# Patient Record
Sex: Male | Born: 1945 | Race: White | Hispanic: No | Marital: Married | Smoking: Former smoker
Health system: Southern US, Community
[De-identification: ages and names within clinical notes are randomized; demographics above are authoritative.]

## PROBLEM LIST (undated history)

## (undated) DIAGNOSIS — I43 Cardiomyopathy in diseases classified elsewhere: Secondary | ICD-10-CM

## (undated) DIAGNOSIS — I10 Essential (primary) hypertension: Secondary | ICD-10-CM

## (undated) DIAGNOSIS — I119 Hypertensive heart disease without heart failure: Secondary | ICD-10-CM

## (undated) DIAGNOSIS — K279 Peptic ulcer, site unspecified, unspecified as acute or chronic, without hemorrhage or perforation: Secondary | ICD-10-CM

## (undated) DIAGNOSIS — K5792 Diverticulitis of intestine, part unspecified, without perforation or abscess without bleeding: Secondary | ICD-10-CM

## (undated) DIAGNOSIS — M549 Dorsalgia, unspecified: Secondary | ICD-10-CM

## (undated) HISTORY — DX: Diverticulitis of intestine, part unspecified, without perforation or abscess without bleeding: K57.92

## (undated) HISTORY — PX: BACK SURGERY: SHX140

## (undated) HISTORY — DX: Cardiomyopathy in diseases classified elsewhere: I43

## (undated) HISTORY — DX: Peptic ulcer, site unspecified, unspecified as acute or chronic, without hemorrhage or perforation: K27.9

## (undated) HISTORY — DX: Essential (primary) hypertension: I10

## (undated) HISTORY — DX: Dorsalgia, unspecified: M54.9

## (undated) HISTORY — DX: Hypertensive heart disease without heart failure: I11.9

---

## 2002-05-18 ENCOUNTER — Encounter: Payer: Self-pay | Admitting: Neurosurgery

## 2002-05-18 ENCOUNTER — Ambulatory Visit (HOSPITAL_COMMUNITY): Admission: RE | Admit: 2002-05-18 | Discharge: 2002-05-19 | Payer: Self-pay | Admitting: Neurosurgery

## 2002-06-11 ENCOUNTER — Encounter: Payer: Self-pay | Admitting: Neurosurgery

## 2002-06-11 ENCOUNTER — Observation Stay (HOSPITAL_COMMUNITY): Admission: RE | Admit: 2002-06-11 | Discharge: 2002-06-12 | Payer: Self-pay | Admitting: Neurosurgery

## 2002-08-18 ENCOUNTER — Ambulatory Visit: Admission: RE | Admit: 2002-08-18 | Discharge: 2002-08-18 | Payer: Self-pay | Admitting: Neurosurgery

## 2002-10-18 ENCOUNTER — Ambulatory Visit (HOSPITAL_COMMUNITY): Admission: RE | Admit: 2002-10-18 | Discharge: 2002-10-19 | Payer: Self-pay | Admitting: Neurosurgery

## 2002-10-18 ENCOUNTER — Encounter: Payer: Self-pay | Admitting: Neurosurgery

## 2003-02-16 ENCOUNTER — Encounter: Payer: Self-pay | Admitting: Neurosurgery

## 2003-02-16 ENCOUNTER — Encounter: Payer: Self-pay | Admitting: Radiology

## 2003-02-16 ENCOUNTER — Encounter: Admission: RE | Admit: 2003-02-16 | Discharge: 2003-02-16 | Payer: Self-pay | Admitting: Neurosurgery

## 2003-03-03 ENCOUNTER — Encounter: Admission: RE | Admit: 2003-03-03 | Discharge: 2003-03-03 | Payer: Self-pay | Admitting: Neurosurgery

## 2003-11-11 ENCOUNTER — Inpatient Hospital Stay (HOSPITAL_COMMUNITY): Admission: RE | Admit: 2003-11-11 | Discharge: 2003-11-16 | Payer: Self-pay | Admitting: Neurosurgery

## 2005-11-08 ENCOUNTER — Encounter: Admission: RE | Admit: 2005-11-08 | Discharge: 2005-11-08 | Payer: Self-pay | Admitting: Anesthesiology

## 2005-12-23 ENCOUNTER — Ambulatory Visit (HOSPITAL_BASED_OUTPATIENT_CLINIC_OR_DEPARTMENT_OTHER): Admission: RE | Admit: 2005-12-23 | Discharge: 2005-12-23 | Payer: Self-pay | Admitting: Urology

## 2006-11-25 ENCOUNTER — Ambulatory Visit (HOSPITAL_COMMUNITY): Admission: RE | Admit: 2006-11-25 | Discharge: 2006-11-25 | Payer: Self-pay | Admitting: Anesthesiology

## 2008-02-29 ENCOUNTER — Ambulatory Visit (HOSPITAL_COMMUNITY): Admission: RE | Admit: 2008-02-29 | Discharge: 2008-02-29 | Payer: Self-pay | Admitting: Anesthesiology

## 2009-12-27 ENCOUNTER — Ambulatory Visit: Payer: Self-pay | Admitting: Cardiovascular Disease

## 2010-07-31 ENCOUNTER — Other Ambulatory Visit: Payer: Self-pay | Admitting: *Deleted

## 2010-07-31 DIAGNOSIS — I10 Essential (primary) hypertension: Secondary | ICD-10-CM

## 2010-07-31 NOTE — Telephone Encounter (Signed)
REFILL PER FAX FROM PHARMACY  

## 2010-08-09 ENCOUNTER — Other Ambulatory Visit: Payer: Self-pay | Admitting: Cardiovascular Disease

## 2010-08-09 DIAGNOSIS — I10 Essential (primary) hypertension: Secondary | ICD-10-CM

## 2010-08-09 MED ORDER — CLONIDINE HCL 0.3 MG PO TABS: 0.3000 mg | ORAL_TABLET | Freq: Two times a day (BID) | ORAL | Status: DC

## 2010-08-09 MED ORDER — RAMIPRIL 10 MG PO CAPS: 10.0000 mg | ORAL_CAPSULE | Freq: Every day | ORAL | Status: DC

## 2010-08-09 NOTE — Telephone Encounter (Signed)
Fax received from pharmacy. Jodette Nayleah Gamel RN  

## 2010-09-05 ENCOUNTER — Other Ambulatory Visit: Payer: Self-pay | Admitting: *Deleted

## 2010-09-05 DIAGNOSIS — I10 Essential (primary) hypertension: Secondary | ICD-10-CM

## 2010-09-05 MED ORDER — METOPROLOL SUCCINATE ER 200 MG PO TB24: 200.0000 mg | ORAL_TABLET | Freq: Every day | ORAL | Status: DC

## 2010-09-05 NOTE — Telephone Encounter (Signed)
Fax received from pharmacy. Refill completed. Jodette Ewa Hipp RN  

## 2010-09-21 NOTE — H&P (Signed)
NAMETREVONNE, NYLAND NO.:  [id number]   MEDICAL RECORD NO.:  [id number]                   PATIENT TYPE:  OIB   LOCATION:  3036                                 FACILITY:  [facility]   PHYSICIAN:  Hilda Lias, M.D.                DATE OF BIRTH:  1945/07/26   DATE OF ADMISSION:  10/18/2002  DATE OF DISCHARGE:                                HISTORY & PHYSICAL   HISTORY OF PRESENT ILLNESS:  Mr. Coutant is a gentleman who in February  2004 underwent L4-5 diskectomy through a extraforaminal port.  At the time  he had some degenerative joint disease, plus a marked osteophyte  compromising the L4 nerve root.  The patient did well for awhile but in two  to three weeks, he started to complain of pain all the way down to his right  leg.  The patient has had every single conservative treatment including  epidural injections and pain medications and he is not any better.  Repeat  MRI showed that although the extraforaminal component was clean, there was  quite a bit of interforminal component such as degenerative disk disease,  hypertrophic facet.  Because of persistent pain, and because he failed  conservative treatment, he wanted to proceed with surgery.   PAST MEDICAL HISTORY:  1. Right L4-5 extraforaminal diskectomy.  2. He has history of hypertension.  3. He has history of diabetes and high blood pressure.   SOCIAL HISTORY:  He drinks socially.  He does not smoke.   FAMILY HISTORY:  History of diabetes and high blood pressure.   PHYSICAL EXAMINATION:  The patient came to my office with his wife on  several occasions.  He is complaining of back pain that radiates down the  right leg.  He is limping from the right leg and he had difficulty standing.   PHYSICAL EXAMINATION:  HEENT:  Normal.  NECK:  Normal.  LUNGS:  Clear.  HEART:  Heart sounds normal.  ABDOMEN:  Normal.  EXTREMITIES:  Normal pulses.  NEUROLOGIC:  Mental status normal.  Cranial nerves  normal.  Strength 5/5  except for weakness of dorsiflexors of the right foot.  He also has mild  weakening in the right quadriceps.  Reflexes are symmetrical.  On sensory,  he complained of numbness which involved mostly the L4 nerve root.   LABORATORY DATA:  The MRI results shows he has quite a bit of degenerative  disk disease with a hypertrophied facet at L4-5 bilaterally, right worse  than the left.  The extraforaminal components previously seen on the MRI are  gone.   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    RECOMMENDATIONS:  The patient will be admitted and we are going to proceed  with a right L4-5 foraminotomy and decision of diskectomy will be made  during surgery.  The patient and his wife know all the risks such as  infection, CSF leak, no improvement whatsoever, need of further surgery  which might include fusion.                                               Hilda Lias, M.D.    EB/MEDQ  D:  10/18/2002  T:  10/18/2002  Job:  [id number]

## 2010-09-21 NOTE — Op Note (Signed)
NAME:  Christian [name], ROUT NO.:  [id number]   MEDICAL RECORD NO.:  [id number]                   PATIENT TYPE:  INP   LOCATION:  2899                                 FACILITY:  [facility]   PHYSICIAN:  Hilda Lias, M.D.                DATE OF BIRTH:  09-07-1945   DATE OF PROCEDURE:  11/11/2003  DATE OF DISCHARGE:                                 OPERATIVE REPORT   PREOPERATIVE DIAGNOSES:  L4-5, L5-S1 herniated disk, degenerative disk  disease, chronic back pain, __________-sided radiculopathy.   POSTOPERATIVE DIAGNOSES:  L4-5, L5-S1 herniated disk, degenerative disk  disease, chronic back pain, __________-sided radiculopathy.   PROCEDURE:  L4-5, L5-S1 diskectomy, decompression of the L4, L5, S1 nerve  root, interbody fusion with allograft, pedicle screws L4 to S1 bilaterally  with a Cross Link posterior arthrodesis without allograft.  C-arm  ___________   SURGEON:  Hilda Lias, MD.   ASSISTANT:  Coletta Memos, M.D.   CLINICAL HISTORY:  The patient was admitted because of back pain with  radiation down to both legs.  The patient previously underwent an L4-5  diskectomy through an extraforaminal approach.  X-rays show severe  degenerative disk disease at the L4-5, 5-1.  After the patient had failed a  course of treatment, he wanted to proceed with surgery.  The risks were  explained in the history and physical.   PROCEDURE:  The patient was taken to the OR and placed on the appropriate  monitoring.  The back was prepped with Betadine.  A midline incision  following the previous one was made from L3-4 down to L5-S1.  The muscles  were retracted laterally.  With the Leksell, we removed the spinous process  of 4-5.  We went laterally and did a facetectomy.  On the right side, we  found quite a bit scar tissue mostly at the level of 4-5.  Using the drill,  we did a bilateral laminectomy.  We decompressed the L4-L5-S1 nerve root.  The worse was the level at  the right side where there were quite a bit of  adhesions mostly affecting the L5 and L4 nerve root.  Having done this, we  entered the disk space and bilateral diskectomy of 4-5 was done.  The same  procedure was done at the level of 5-1.  Using the curette, we removed the  end-plate posterolateral.  This was followed by an hourglass of 10 x 24 at 4-  5 and 8 x 24 at the level of 5-1.  Then using Vitoss plus the patient's own  bone, we filled up the rest of the space.  Then with the help of the C-arm,  we localized the pedicle of 4-5 and S1.  A pedicle probe was inserted  followed by a screw, which was 6.5 x 45.  AP and lateral showed good  position of the pedicle screws.  From then on, a rod was inserted  on top of  the pedicle and screws followed by a CAPP.  It was secured in placed.  Then  a Cross Link from right to left was used.  We went laterally, we drilled the  transverse process of 4-5 and the ala of the sacrum.  A mix of Vitoss as  well as the autograft was used to fill up the space.  From then on, the area  was irrigated.  __________ was left in the epidural space, and the wound  closed with Vicryl and a Steri-Strip.                                               Hilda Lias, M.D.    EB/MEDQ  D:  11/11/2003  T:  11/12/2003  Job:  [id number]

## 2010-09-21 NOTE — Op Note (Signed)
Christian [name], Christian [name] NO.:  [id number]   MEDICAL RECORD NO.:  [id number]                   PATIENT TYPE:  OIB   LOCATION:  2888                                 FACILITY:  [facility]   PHYSICIAN:  Hilda Lias, M.D.                DATE OF BIRTH:  [birth year]   DATE OF PROCEDURE:  10/18/2002  DATE OF DISCHARGE:                                 OPERATIVE REPORT   PREOPERATIVE DIAGNOSIS:  Right L4-5 herniated disk with stenosis, incidental  left L5-S1 herniated disk.   POSTOPERATIVE DIAGNOSIS:  Right L4-5 herniated disk with stenosis,  incidental left L5-S1 herniated disk.   PROCEDURE:  Right L5-S1 diskectomy, foraminotomy, decompression of the L4  and L5 nerve root.  Microscope.   SURGEON:  Hilda Lias, M.D.   ASSISTANT:  Cristi Loron, M.D.   INDICATIONS FOR PROCEDURE:  The patient is a 65 year old gentleman with  diabetes who back in February underwent right L4-5 extraforaminal diskectomy  because of L4 radiculopathy.  The patient did well for a while, but now is  complaining of worsening of the pain down to the right foot.  He has failed  with more conservative treatment.  X-ray showed a stenosis with degenerative  disk disease at the level of 4-5. He has incidental 5-1 on the left.  Surgery was advised and the risks were explained in the history and  physical.   DESCRIPTION OF PROCEDURE:  The patient was taken to the OR where he was  positioned in a prone manner.  The back was prepped with Betadine.  The  drapes were applied.  We excised the previous scar.  His skin is a little  bit too loose and the scar in the midline a little bit to the left.  Nevertheless, after dissection we did our muscle retraction on the right  side.  We went straight down to the L4-5 space.  X-ray indeed showed that we  were in that area.  With the drill, we removed part of the lamina of L5 and  L4 and we removed the yellow ligament.  We brought the microscope  into the  area.  Indeed there was herniated disk with some extension down into the  level of 4-5. Incision was made and a large amount of degenerative disk  disease not only medially, but also laterally was removed.  We did a total  gross diskectomy using the microcurettes.  At the end, we had good  decompression for the foramen of L4 and L5.  X-ray was taken which showed  that indeed that was the right area. There was some scar tissue where we  previously had the extraforaminal diskectomy and lysis was accomplished from  the intraforaminal approach.  Having good decompression, the area was  irrigated and Valsalva maneuver was negative and the wound was closed with  Vicryl and Steri-Strips.  Hilda Lias, M.D.    EB/MEDQ  D:  10/18/2002  T:  10/18/2002  Job:  [id number]

## 2010-09-21 NOTE — Op Note (Signed)
Christian [name], Christian [name] NO.:  [id number]   MEDICAL RECORD NO.:  [id number]          PATIENT TYPE:  AMB   LOCATION:  [facility]                         FACILITY:  [facility]   PHYSICIAN:  Valetta Fuller, M.D.  DATE OF BIRTH:  [birth year]   DATE OF PROCEDURE:  12/23/2005  DATE OF DISCHARGE:                                 OPERATIVE REPORT   PREOPERATIVE DIAGNOSIS:  Right hydronephrosis.   POSTOPERATIVE DIAGNOSES:  1. Right hydronephrosis.  2. Partial ureteropelvic junction obstruction.   PROCEDURE PERFORMED:  Cystoscopy, right retrograde pyelography, right  ureteroscopy.   SURGEON:  Valetta Fuller, M.D.   ANESTHESIA:  General.   INDICATIONS:  [name] is a 65 year old male who was sent to see me  because of some right hydronephrosis and questionable right proximal  ureteral dilation on an MRI done to assess some back issues.  The patient  was asymptomatic.  On MRI, there was evidence of right hydronephrosis and a  question of a dilated ureter.  The patient subsequently had an ultrasound  which did show mild right hydronephrosis and the right ureter was not  identified.  In our office, we did a CT scan with contrast.  This did show  what appeared to be a dilation of the right renal pelvis and collecting  system without dilation of the ureter, and the CT findings were consistent  potentially with ureteropelvic junction obstruction, probably mild.  I was  concerned, however, that initial imaging studies had suggested the  possibility of a dilated ureter, and therefore, we did feel that additional  assessment was required to make sure that he did not have any ureteral  pathology that potentially would be of more concern.  The patient presents  now for that.   TECHNIQUE AND FINDINGS:  The patient underwent successful induction of  general anesthesia.  He was then placed in lithotomy position and prepped  and draped in the normal manner.  The patient did require some  meatal  dilation to allow for placement of the cystoscope.  The anterior urethra was  unremarkable.  He had minimal trilobar hyperplasia without significant  visual obstruction.  The bladder was otherwise endoscopically unremarkable.   Attention was then turned towards retrograde pyelography.  This was done  with fluoroscopic guidance and representative images were saved.  I  interpreted the retrograde.  An 8-French cone-tip catheter was utilized.  The entire ureter was completely normal.  There was a jet affective contrast  at the ureteropelvic junction with a moderately-dilated renal pelvis and  caliceal system.  The fluoroscopic films were consistent with an underlying  partial UPJ obstruction.   Attention was then turned towards direct vision ureteroscopy.  A guidewire  was placed in the renal pelvis without difficulty.  A long 6.5-French  ureteroscope was then easily engaged in the distal ureter and brought up to  the ureteropelvic junction region.  There, we could easily visualize this  area and there was no other pathology other than  narrowing with some little bit of tortuosity.  Endoscopic findings were  again consistent with an underlying partial UPJ obstruction.  We did not  feel that stent placement was required.  The patient appeared to tolerate  the procedure well, and he was brought to the recovery room in stable  condition.           ______________________________  Valetta Fuller, M.D.  Electronically Signed     DSG/MEDQ  D:  12/23/2005  T:  12/23/2005  Job:  [id number]

## 2010-09-21 NOTE — Discharge Summary (Signed)
NAME:  Christian [name], Christian [name] NO.:  [id number]   MEDICAL RECORD NO.:  [id number]                   PATIENT TYPE:  INP   LOCATION:  3004                                 FACILITY:  [facility]   PHYSICIAN:  Hilda Lias, M.D.                DATE OF BIRTH:  21-Jul-1945   DATE OF ADMISSION:  11/11/2003  DATE OF DISCHARGE:  11/16/2003                                 DISCHARGE SUMMARY   ADMISSION DIAGNOSIS:  Degenerative disk disease, 4-5 and 5-1.   FINAL DIAGNOSIS:  Degenerative joint disease, 4-5 and 5-1.   HISTORY:  The patient was admitted because of back pain with radiation down  to both legs.  X-rays showed severe degenerative disk disease at the level  of 4-5 and 5-1.  Previously, this gentleman had an extraforaminal diskectomy  at the level of 4-5.  Surgery was advised.   LABORATORY DATA:  Normal.   HOSPITAL COURSE:  The patient was taken to surgery and underwent a 4-5  diskectomy followed by fusion with pedicle screw was done.  Today, the  patient is doing much better.  He is ambulating.  He has minimal discomfort.  He is ready to go home.   CONDITION ON DISCHARGE:  Improved.   DISCHARGE MEDICATIONS:  1. Percocet.  2. Baclofen.   DIET:  Regular.   ACTIVITY:  Not to drive, not to do any lifting.   FOLLOWUP:  He will be seen by me in my office in four weeks.                                                Hilda Lias, M.D.    EB/MEDQ  D:  11/16/2003  T:  11/16/2003  Job:  [id number]

## 2010-09-21 NOTE — Op Note (Signed)
NAMECONWAY, FEDORA NO.:  [id number]   MEDICAL RECORD NO.:  [id number]                   PATIENT TYPE:  INP   LOCATION:  3008                                 FACILITY:  [facility]   PHYSICIAN:  Hilda Lias, M.D.                DATE OF BIRTH:  06/13/1945   DATE OF PROCEDURE:  06/11/2002  DATE OF DISCHARGE:                                 OPERATIVE REPORT   PREOPERATIVE DIAGNOSIS:  Right L4-5 extraforaminal herniated disk with a  large osteophyte compromising the L4 nerve root.   POSTOPERATIVE DIAGNOSIS:  Right L4-5 extraforaminal herniated disk with a  large osteophyte compromising the L4 nerve root.   PROCEDURE:  Right L4-5 diskectomy through extrapyramidal approach with  removal of large osteophyte using the left contralateral incision, video  microscope C-arm Metrix.   SURGEON:  Hilda Lias, M.D.   ASSISTANT:  Coletta Memos, M.D.   HISTORY:  Mr. Duckett is a 65 year old gentleman complaining of back and  right leg pain for many years.  MRI was essentially negative.  He has an  incidental disk at L5-S1.  We did a myelogram which showed that indeed he  has a herniated disk, extraforaminal, at L4-5 with a large osteophyte  compromising the L4 nerve root.  The patient wanted to go ahead with surgery  because of the continuation of the pain.   The patient was taken to the OR, and __________ .  Because of the location  of the osteophytes, instead of doing a right incision, we did an incision on  the left side, and with the Metrix using several dilators, we were able to  go straight to the area of 4-5 extraforaminal.  With L2 visualized,  __________ of L4-5.  This was done with the fluoroscopy C-arm.  Having done  this, we drilled the lower facet of L4 and the upper part of L5.  We were  able to identify the L4 nerve root, which was displaced laterally and pushed  backwards.  We tried to introduce a probe, and it was quite narrow  bilaterally.   Then with the C-arm, we were able to localize the disk.  A  diskectomy was accomplished extraforaminal.  Then we followed the nerve  root, and indeed, there was like a groove and would justify compromising the  L4.  Retraction of the nerve root was accomplished with the drill, as well  as the 1 and 2 mm Kerrison punch.  We were able to remove the osteophyte  with plenty of room for the L4 nerve root.  Having done this, the area was  irrigated.  Fentanyl and Depo-Medrol were left in the dural space, and the  wound was closed with Vicryl and Steri-Strips.  Hilda Lias, M.D.    EB/MEDQ  D:  06/11/2002  T:  06/12/2002  Job:  [id number]

## 2010-09-21 NOTE — H&P (Signed)
Christian [name], HINEMAN NO.:  [id number]   MEDICAL RECORD NO.:  [id number]                   PATIENT TYPE:  INP   LOCATION:  2899                                 FACILITY:  [facility]   PHYSICIAN:  Hilda Lias, M.D.                DATE OF BIRTH:  [birth year]   DATE OF ADMISSION:  06/11/2002  DATE OF DISCHARGE:                                HISTORY & PHYSICAL   HISTORY OF PRESENT ILLNESS:  The patient is a gentleman who was seen by me  at the end of last year because of back pain with radiation down to the  right leg which is getting worse.  He has __________.  He had an MRI which  really was not helpful.  The patient has had conservative treatment and he  is not any better.  Because of that, we proceeded with a lumbar myelogram,  and because of the findings he wanted to undergo surgery.   PAST MEDICAL HISTORY:  Negative.   ALLERGIES:  PENICILLIN.   REVIEW OF SYMPTOMS:  Positive for high blood pressure, diabetes, back pain.   SOCIAL HISTORY:  The patient does not smoke, he drinks socially.   FAMILY HISTORY:  History of diabetes and high blood pressure in his family.   PHYSICAL EXAMINATION:  GENERAL:  The patient came to my office with his  wife.  He was limping from the right leg.  NECK:  Normal.  HEART:  Heart sounds normal.  ABDOMEN:  Normal.  EXTREMITIES:  Normal pulses.  NEUROLOGIC:  Mental status normal.  Cranial nerves normal.  Strength was  5/5, except he has some weakness of the quadriceps and iliopsoas.  There is  no ___________.  Reflexes are symmetrical.  No Babinski.  Straight leg  raising is negative at 90 degrees.  __________ stretch maneuver is positive  bilaterally with the right one worse then the left one.   LABORATORY DATA:  The myelogram showed that he has an incidental disk at the  level of 5-1 on the left side, has no problems whatsoever with him.  At the  level of 4-5, he has a herniated disk with two component, a large  osteophyte, and also a soft herniated disk compromising the L4 nerve root.   IMPRESSION:  Right L4-5 extraforaminal herniated disk with a high soft  component compromising the L4 nerve root.   RECOMMENDATIONS:  The patient is being admitted for surgery.  The procedure  will be a right L4-5 extraforaminal diskectomy using the microscope and the  Medrix system.  The patient knows all the risks such as infection, CSF leak,  worsening of the pain, paralysis, no improvement whatsoever, and need for  further surgery.  Hilda Lias, M.D.   EB/MEDQ  D:  06/11/2002  T:  06/11/2002  Job:  [id number]

## 2010-09-21 NOTE — H&P (Signed)
NAME:  Christian [name], Christian [name] NO.:  [id number]   MEDICAL RECORD NO.:  [id number]                   PATIENT TYPE:  INP   LOCATION:  3004                                 FACILITY:  [facility]   PHYSICIAN:  Hilda Lias, M.D.                DATE OF BIRTH:  22-May-1945   DATE OF ADMISSION:  11/11/2003  DATE OF DISCHARGE:                                HISTORY & PHYSICAL   HISTORY OF PRESENT ILLNESS:  Christian [name] was a gentleman who underwent  right L4-5 anterior lumbar diskectomy in June 2004.  Nevertheless, the  patient has been complaining of back pain, radiation down both legs which  has been getting worse lately.  The patient has had conservative treatment  including ___________Percocet 10 mg one or two every three hours without any  improvement.  The patient for a while says that he was doing better, but  nevertheless he continued to get worse.  He had a repeat MRI, and because of  the findings he wants to proceed with surgery.   PAST MEDICAL HISTORY:  1. Lumbar diskectomy, L4-5.  2. History of high blood pressure.  3. Diabetes.   SOCIAL HISTORY:  Does not smoke, drinks socially.   FAMILY HISTORY:  Family history of diabetes, high blood pressure.   REVIEW OF SYSTEMS:  Positive for diabetes and high blood pressure.   PHYSICAL EXAMINATION:  GENERAL:  The patient came to my office with his  wife.  I have been following him for several months.  He denies difficulty  sitting and standing.  HEENT:  Normal.  NECK:  Normal.  HEART:  Normal.  ABDOMEN:  Normal.  EXTREMITIES:  Normal pulses.  NEUROLOGIC:  Mental status normal.  Cranial nerves normal.  Strength:  He  has _________dorsiflexion both feet.  Straight leg raising is positive at 45  degrees bilaterally.  Sensation:  He has decrease of pinprick at the L4 and  L5 nerve root.   LABORATORY DATA:  X-ray as well as MRI shows a severe case of degenerative  disk disease at the level of L4-5.   IMPRESSION:   Degenerative disk disease, L4-5 and L5-S1 with a chronic  radiculopathy.   RECOMMENDATIONS:  The patient is being admitted for L4-5 and L5-S1  diskectomy, interbody fusion, pedicle screws.  He and his wife know the  risks such as infection, CSF leak, worsening of the pain, paralysis, no  improvement whatsoever, damage to the vessels of the abdomen, damage to the  nerves, and need for further surgery.                                                Hilda Lias, M.D.    EB/MEDQ  D:  11/11/2003  T:  11/11/2003  Job:  [id number]

## 2010-12-24 ENCOUNTER — Encounter: Payer: Self-pay | Admitting: Cardiovascular Disease

## 2010-12-25 ENCOUNTER — Other Ambulatory Visit: Payer: Self-pay | Admitting: *Deleted

## 2010-12-25 MED ORDER — AMLODIPINE BESYLATE 10 MG PO TABS: 10.0000 mg | ORAL_TABLET | Freq: Every day | ORAL | Status: DC

## 2010-12-25 NOTE — Telephone Encounter (Signed)
Fax received from pharmacy. Refill completed. Jodette Norville Dani RN  

## 2011-01-09 ENCOUNTER — Encounter: Payer: Self-pay | Admitting: Cardiovascular Disease

## 2011-01-09 ENCOUNTER — Ambulatory Visit (INDEPENDENT_AMBULATORY_CARE_PROVIDER_SITE_OTHER): Payer: Medicare Other | Admitting: Cardiovascular Disease

## 2011-01-09 DIAGNOSIS — E785 Hyperlipidemia, unspecified: Secondary | ICD-10-CM

## 2011-01-09 DIAGNOSIS — I10 Essential (primary) hypertension: Secondary | ICD-10-CM

## 2011-01-09 LAB — BASIC METABOLIC PANEL
BUN: 19 mg/dL (ref 6–23)
CO2: 30 mEq/L (ref 19–32)
Calcium: 9.8 mg/dL (ref 8.4–10.5)
Chloride: 99 mEq/L (ref 96–112)
Creatinine, Ser: 1.2 mg/dL (ref 0.4–1.5)
GFR: 65.39 mL/min (ref >60.00)
Glucose, Bld: 189 mg/dL — ABNORMAL HIGH (ref 70–99)
Potassium: 5.4 mEq/L — ABNORMAL HIGH (ref 3.5–5.1)
Sodium: 138 mEq/L (ref 135–145)

## 2011-01-09 LAB — LIPID PANEL
Cholesterol: 115 mg/dL (ref 0–200)
HDL: 26.4 mg/dL — ABNORMAL LOW (ref >39.00)
Total CHOL/HDL Ratio: 4
Triglycerides: 279 mg/dL — ABNORMAL HIGH (ref 0.0–149.0)
VLDL: 55.8 mg/dL — ABNORMAL HIGH (ref 0.0–40.0)

## 2011-01-09 LAB — HEPATIC FUNCTION PANEL
ALT: 37 U/L (ref 0–53)
AST: 28 U/L (ref 0–37)
Albumin: 4.6 g/dL (ref 3.5–5.2)
Alkaline Phosphatase: 40 U/L (ref 39–117)
Bilirubin, Direct: 0.1 mg/dL (ref 0.0–0.3)
Total Bilirubin: 0.3 mg/dL (ref 0.3–1.2)
Total Protein: 7.4 g/dL (ref 6.0–8.3)

## 2011-01-09 LAB — LDL CHOLESTEROL, DIRECT: Direct LDL: 64.4 mg/dL

## 2011-01-09 NOTE — Progress Notes (Signed)
Christian [name] Date of Birth  03/25/1946 [facility] / [facility] [address]     [address] [place], [place]  [address] [phone]  Fax  [phone]  History of Present Illness:  65 year old gentleman with a history of hypertension and hypertensive cardiopathy.  He's having lots of problems with back pain and sees Dr. Vear Clock. He's able to walk fairly short distances because of his back pain. He denies any cardiac problems with his exercise   Current Outpatient Prescriptions on File Prior to Visit  Medication Sig Dispense Refill  . ALPRAZolam (XANAX) 1 MG tablet Take 1 mg by mouth as needed.        Marland Kitchen amLODipine (NORVASC) 10 MG tablet Take 1 tablet (10 mg total) by mouth daily.  90 tablet  3  . aspirin 81 MG tablet Take 81 mg by mouth daily.        . cloNIDine (CATAPRES) 0.3 MG tablet Take 1 tablet (0.3 mg total) by mouth 2 (two) times daily.  180 tablet  3  . ezetimibe-simvastatin (VYTORIN) 10-10 MG per tablet Take 1 tablet by mouth daily.        . fenofibrate (TRICOR) 145 MG tablet Take 145 mg by mouth daily.        Marland Kitchen levETIRAcetam (KEPPRA) 250 MG tablet Take 1,000 mg by mouth daily.       . metoprolol (TOPROL XL) 200 MG 24 hr tablet Take 1 tablet (200 mg total) by mouth daily.  30 tablet  11  . oxyCODONE (OXYCONTIN) 15 MG TB12 Take 15 mg by mouth as needed.       . potassium chloride (KLOR-CON) 20 MEQ packet Take 20 mEq by mouth daily.        . ramipril (ALTACE) 10 MG capsule Take 1 capsule (10 mg total) by mouth daily.  90 capsule  3  . sitaGLIPtan-metformin (JANUMET) 50-1000 MG per tablet Take 1 tablet by mouth 2 (two) times daily.        Marland Kitchen glipiZIDE (GLUCOTROL) 10 MG tablet Take 10 mg by mouth 2 (two) times daily.          Allergies  Allergen Reactions  . Penicillins     Past Medical History  Diagnosis Date  . Diabetes mellitus   . Hypertension   . Hypertensive cardiomyopathy   . Peptic ulcer disease   . Back pain   . Diverticulitis      Past Surgical History  Procedure Date  . Back surgery     x3    History  Smoking status  . Former Smoker  . Quit date: 12/23/1968  Smokeless tobacco  . Not on file    History  Alcohol Use No    Family History  Problem Relation Age of Onset  . Heart failure Mother   . Cancer Father   . Diabetes Brother     Reviw of Systems:  Reviewed in the HPI.  All other systems are negative.  Physical Exam: BP 114/80  Pulse 91  Ht 5' 7.5" (1.715 m)  Wt 173 lb 3.2 oz (78.563 kg)  BMI 26.73 kg/m2 The patient is alert and oriented x 3.  The mood and affect are normal.   Skin: warm and dry.  Color is normal.    HEENT:   the sclera are nonicteric.  The mucous membranes are moist.  The carotids are 2+ without bruits.  There is no thyromegaly.  There is no JVD.    Lungs: clear.  The chest wall is non tender.    Heart: regular rate with a normal S1 and S2.  There are no murmurs, gallops, or rubs. The PMI is not displaced.     Abdomen: good bowel sounds.  There is no guarding or rebound.  There is no hepatosplenomegaly or tenderness.  There are no masses.   Extremities:  no clubbing, cyanosis, or edema.  The legs are without rashes.  The distal pulses are intact.   Neuro:  Cranial nerves II - XII are intact.  Motor and sensory functions are intact.    The gait is normal.  ECG:  Assessment / Plan:

## 2011-01-09 NOTE — Assessment & Plan Note (Signed)
His blood pressure is well controlled. We'll continue the same medications.

## 2011-01-10 ENCOUNTER — Telehealth: Payer: Self-pay | Admitting: Cardiovascular Disease

## 2011-01-10 ENCOUNTER — Encounter: Payer: Self-pay | Admitting: Cardiovascular Disease

## 2011-01-10 NOTE — Telephone Encounter (Signed)
Called wife back, she stated he has been eating more carbs due to daily nausea/emesis carbs stay down better. She feels its due to elevated pain from back. Pt to go to pcp to evaluate. Wife agreed and labs were forwarded to pcp.

## 2011-01-10 NOTE — Telephone Encounter (Signed)
Patient called with lab results. Pt verbalized understanding. Jodette Fatima Fedie RN  

## 2011-01-10 NOTE — Telephone Encounter (Signed)
Called returning your phone call regarding her husbands lab work. Please call back.

## 2011-01-10 NOTE — Telephone Encounter (Signed)
Calling back because her husband was physically ill and did not remember the instructions that you gave him. She would really appreciate it if you could give her a call back and repeat those instructions. Also would like to know about his potassium levels that he was tested for. Please call back.

## 2011-02-01 ENCOUNTER — Other Ambulatory Visit: Payer: Self-pay | Admitting: Neurosurgery

## 2011-02-01 DIAGNOSIS — M549 Dorsalgia, unspecified: Secondary | ICD-10-CM

## 2011-02-01 DIAGNOSIS — M5410 Radiculopathy, site unspecified: Secondary | ICD-10-CM

## 2011-02-05 ENCOUNTER — Other Ambulatory Visit: Payer: Medicare Other

## 2011-02-05 ENCOUNTER — Ambulatory Visit
Admission: RE | Admit: 2011-02-05 | Discharge: 2011-02-05 | Disposition: A | Discharge status: Home / Self Care | Payer: Medicare Other | Source: Ambulatory Visit | Attending: Neurosurgery | Admitting: Neurosurgery

## 2011-02-05 DIAGNOSIS — M5410 Radiculopathy, site unspecified: Secondary | ICD-10-CM

## 2011-02-05 DIAGNOSIS — M549 Dorsalgia, unspecified: Secondary | ICD-10-CM

## 2011-02-05 MED ORDER — OXYCODONE-ACETAMINOPHEN 5-325 MG PO TABS
3.0000 | ORAL_TABLET | Freq: Once | ORAL | Status: AC
  Administered 2011-02-05: 3 via ORAL

## 2011-02-05 MED ORDER — DIAZEPAM 2 MG PO TABS
5.0000 mg | ORAL_TABLET | Freq: Once | ORAL | Status: AC
  Administered 2011-02-05: 5 mg via ORAL

## 2011-02-05 MED ORDER — IOHEXOL 180 MG/ML  SOLN
15.0000 mL | Freq: Once | INTRAMUSCULAR | Status: AC | PRN
  Administered 2011-02-05: 15 mL via INTRATHECAL

## 2011-02-05 NOTE — Progress Notes (Signed)
Dr. Carlota Raspberry in to visit and discuss pain options with pt post procedure. It was decided valium would be best at this time.

## 2011-02-15 ENCOUNTER — Ambulatory Visit (HOSPITAL_COMMUNITY)
Admission: RE | Admit: 2011-02-15 | Discharge: 2011-02-15 | Disposition: A | Discharge status: Home / Self Care | Payer: Medicare Other | Source: Ambulatory Visit | Attending: Neurosurgery | Admitting: Neurosurgery

## 2011-02-15 ENCOUNTER — Other Ambulatory Visit (HOSPITAL_COMMUNITY): Payer: Self-pay | Admitting: Neurosurgery

## 2011-02-15 ENCOUNTER — Encounter (HOSPITAL_COMMUNITY)
Admission: RE | Admit: 2011-02-15 | Discharge: 2011-02-15 | Disposition: A | Discharge status: Home / Self Care | Payer: Medicare Other | Source: Ambulatory Visit | Attending: Neurosurgery | Admitting: Neurosurgery

## 2011-02-15 DIAGNOSIS — Z01811 Encounter for preprocedural respiratory examination: Secondary | ICD-10-CM

## 2011-02-15 DIAGNOSIS — J438 Other emphysema: Secondary | ICD-10-CM | POA: Insufficient documentation

## 2011-02-15 DIAGNOSIS — Z01812 Encounter for preprocedural laboratory examination: Secondary | ICD-10-CM | POA: Insufficient documentation

## 2011-02-15 DIAGNOSIS — Z01818 Encounter for other preprocedural examination: Secondary | ICD-10-CM | POA: Insufficient documentation

## 2011-02-15 LAB — CBC
HCT: 39 % (ref 39.0–52.0)
Hemoglobin: 13.2 g/dL (ref 13.0–17.0)
MCHC: 33.8 g/dL (ref 30.0–36.0)
RBC: 4.46 MIL/uL (ref 4.22–5.81)

## 2011-02-15 LAB — ABO/RH: ABO/RH(D): A POS

## 2011-02-15 LAB — DIFFERENTIAL
Basophils Absolute: 0.1 10*3/uL (ref 0.0–0.1)
Basophils Relative: 1 % (ref 0–1)
Lymphocytes Relative: 22 % (ref 12–46)
Monocytes Absolute: 0.6 10*3/uL (ref 0.1–1.0)
Neutro Abs: 5.2 10*3/uL (ref 1.7–7.7)
Neutrophils Relative %: 64 % (ref 43–77)

## 2011-02-15 LAB — SURGICAL PCR SCREEN
MRSA, PCR: NEGATIVE
Staphylococcus aureus: NEGATIVE

## 2011-02-15 LAB — TYPE AND SCREEN: Antibody Screen: NEGATIVE

## 2011-02-15 LAB — BASIC METABOLIC PANEL
BUN: 17 mg/dL (ref 6–23)
CO2: 28 mEq/L (ref 19–32)
Chloride: 101 mEq/L (ref 96–112)
GFR calc non Af Amer: 72 mL/min — ABNORMAL LOW (ref >90)
Glucose, Bld: 137 mg/dL — ABNORMAL HIGH (ref 70–99)
Potassium: 4.9 mEq/L (ref 3.5–5.1)
Sodium: 139 mEq/L (ref 135–145)

## 2011-02-20 ENCOUNTER — Inpatient Hospital Stay (HOSPITAL_COMMUNITY)
Admission: RE | Admit: 2011-02-20 | Discharge: 2011-02-25 | DRG: 458 | Disposition: A | Discharge status: Rehabilitation Facility | Payer: Medicare Other | Source: Ambulatory Visit | Attending: Neurosurgery | Admitting: Neurosurgery

## 2011-02-20 ENCOUNTER — Inpatient Hospital Stay (HOSPITAL_COMMUNITY): Payer: Medicare Other

## 2011-02-20 DIAGNOSIS — E7800 Pure hypercholesterolemia, unspecified: Secondary | ICD-10-CM | POA: Diagnosis present

## 2011-02-20 DIAGNOSIS — Z981 Arthrodesis status: Secondary | ICD-10-CM

## 2011-02-20 DIAGNOSIS — M5137 Other intervertebral disc degeneration, lumbosacral region: Secondary | ICD-10-CM | POA: Diagnosis present

## 2011-02-20 DIAGNOSIS — Z794 Long term (current) use of insulin: Secondary | ICD-10-CM

## 2011-02-20 DIAGNOSIS — M51379 Other intervertebral disc degeneration, lumbosacral region without mention of lumbar back pain or lower extremity pain: Secondary | ICD-10-CM | POA: Diagnosis present

## 2011-02-20 DIAGNOSIS — E119 Type 2 diabetes mellitus without complications: Secondary | ICD-10-CM | POA: Diagnosis present

## 2011-02-20 DIAGNOSIS — Z79899 Other long term (current) drug therapy: Secondary | ICD-10-CM

## 2011-02-20 DIAGNOSIS — I10 Essential (primary) hypertension: Secondary | ICD-10-CM | POA: Diagnosis present

## 2011-02-20 DIAGNOSIS — M4000 Postural kyphosis, site unspecified: Principal | ICD-10-CM | POA: Diagnosis present

## 2011-02-20 DIAGNOSIS — M48062 Spinal stenosis, lumbar region with neurogenic claudication: Secondary | ICD-10-CM | POA: Diagnosis present

## 2011-02-20 LAB — GLUCOSE, CAPILLARY
Glucose-Capillary: 162 mg/dL — ABNORMAL HIGH (ref 70–99)
Glucose-Capillary: 168 mg/dL — ABNORMAL HIGH (ref 70–99)
Glucose-Capillary: 160 mg/dL — ABNORMAL HIGH (ref 70–99)
Glucose-Capillary: 147 mg/dL — ABNORMAL HIGH (ref 70–99)

## 2011-02-21 LAB — GLUCOSE, CAPILLARY
Glucose-Capillary: 165 mg/dL — ABNORMAL HIGH (ref 70–99)
Glucose-Capillary: 201 mg/dL — ABNORMAL HIGH (ref 70–99)

## 2011-02-22 DIAGNOSIS — IMO0002 Reserved for concepts with insufficient information to code with codable children: Secondary | ICD-10-CM

## 2011-02-22 DIAGNOSIS — M47817 Spondylosis without myelopathy or radiculopathy, lumbosacral region: Secondary | ICD-10-CM

## 2011-02-22 LAB — GLUCOSE, CAPILLARY
Glucose-Capillary: 178 mg/dL — ABNORMAL HIGH (ref 70–99)
Glucose-Capillary: 196 mg/dL — ABNORMAL HIGH (ref 70–99)

## 2011-02-23 LAB — GLUCOSE, CAPILLARY
Glucose-Capillary: 156 mg/dL — ABNORMAL HIGH (ref 70–99)
Glucose-Capillary: 196 mg/dL — ABNORMAL HIGH (ref 70–99)

## 2011-02-24 LAB — GLUCOSE, CAPILLARY
Glucose-Capillary: 139 mg/dL — ABNORMAL HIGH (ref 70–99)
Glucose-Capillary: 215 mg/dL — ABNORMAL HIGH (ref 70–99)

## 2011-02-25 ENCOUNTER — Inpatient Hospital Stay (HOSPITAL_COMMUNITY)
Admission: RE | Admit: 2011-02-25 | Discharge: 2011-03-06 | DRG: 946 | Disposition: A | Discharge status: Home Health | Payer: Medicare Other | Source: Other Acute Inpatient Hospital | Attending: Physical Medicine & Rehabilitation | Admitting: Physical Medicine & Rehabilitation

## 2011-02-25 DIAGNOSIS — K5900 Constipation, unspecified: Secondary | ICD-10-CM | POA: Diagnosis present

## 2011-02-25 DIAGNOSIS — Z5189 Encounter for other specified aftercare: Secondary | ICD-10-CM

## 2011-02-25 DIAGNOSIS — Z87891 Personal history of nicotine dependence: Secondary | ICD-10-CM

## 2011-02-25 DIAGNOSIS — Z7982 Long term (current) use of aspirin: Secondary | ICD-10-CM

## 2011-02-25 DIAGNOSIS — R000 Tachycardia, unspecified: Secondary | ICD-10-CM | POA: Diagnosis present

## 2011-02-25 DIAGNOSIS — Z880 Allergy status to penicillin: Secondary | ICD-10-CM

## 2011-02-25 DIAGNOSIS — M4716 Other spondylosis with myelopathy, lumbar region: Secondary | ICD-10-CM

## 2011-02-25 DIAGNOSIS — M48062 Spinal stenosis, lumbar region with neurogenic claudication: Secondary | ICD-10-CM | POA: Diagnosis present

## 2011-02-25 DIAGNOSIS — M4000 Postural kyphosis, site unspecified: Secondary | ICD-10-CM | POA: Diagnosis present

## 2011-02-25 DIAGNOSIS — G894 Chronic pain syndrome: Secondary | ICD-10-CM | POA: Diagnosis present

## 2011-02-25 DIAGNOSIS — E119 Type 2 diabetes mellitus without complications: Secondary | ICD-10-CM | POA: Diagnosis present

## 2011-02-25 DIAGNOSIS — Z79899 Other long term (current) drug therapy: Secondary | ICD-10-CM

## 2011-02-25 DIAGNOSIS — Z794 Long term (current) use of insulin: Secondary | ICD-10-CM

## 2011-02-25 DIAGNOSIS — G4700 Insomnia, unspecified: Secondary | ICD-10-CM | POA: Diagnosis present

## 2011-02-25 LAB — URINALYSIS, ROUTINE W REFLEX MICROSCOPIC
Glucose, UA: NEGATIVE mg/dL
Hgb urine dipstick: NEGATIVE
Ketones, ur: NEGATIVE mg/dL
Protein, ur: NEGATIVE mg/dL

## 2011-02-25 LAB — GLUCOSE, CAPILLARY
Glucose-Capillary: 131 mg/dL — ABNORMAL HIGH (ref 70–99)
Glucose-Capillary: 173 mg/dL — ABNORMAL HIGH (ref 70–99)
Glucose-Capillary: 116 mg/dL — ABNORMAL HIGH (ref 70–99)
Glucose-Capillary: 120 mg/dL — ABNORMAL HIGH (ref 70–99)

## 2011-02-26 DIAGNOSIS — M4716 Other spondylosis with myelopathy, lumbar region: Secondary | ICD-10-CM

## 2011-02-26 LAB — COMPREHENSIVE METABOLIC PANEL
ALT: 14 U/L (ref 0–53)
AST: 17 U/L (ref 0–37)
Alkaline Phosphatase: 45 U/L (ref 39–117)
CO2: 27 mEq/L (ref 19–32)
Calcium: 10 mg/dL (ref 8.4–10.5)
Chloride: 96 mEq/L (ref 96–112)
GFR calc non Af Amer: >90 mL/min (ref >90)
Potassium: 3.9 mEq/L (ref 3.5–5.1)
Sodium: 136 mEq/L (ref 135–145)
Total Bilirubin: 0.4 mg/dL (ref 0.3–1.2)

## 2011-02-26 LAB — DIFFERENTIAL
Basophils Absolute: 0.1 10*3/uL (ref 0.0–0.1)
Basophils Relative: 1 % (ref 0–1)
Eosinophils Absolute: 0.6 10*3/uL (ref 0.0–0.7)
Neutro Abs: 6.2 10*3/uL (ref 1.7–7.7)
Neutrophils Relative %: 64 % (ref 43–77)

## 2011-02-26 LAB — CBC
Platelets: 384 10*3/uL (ref 150–400)
RBC: 4.32 MIL/uL (ref 4.22–5.81)
WBC: 9.7 10*3/uL (ref 4.0–10.5)

## 2011-02-26 LAB — GLUCOSE, CAPILLARY
Glucose-Capillary: 153 mg/dL — ABNORMAL HIGH (ref 70–99)
Glucose-Capillary: 93 mg/dL (ref 70–99)

## 2011-02-26 LAB — MRSA PCR SCREENING: MRSA by PCR: NEGATIVE

## 2011-02-26 NOTE — H&P (Signed)
  NAMEURIYAH, RASKA NO.:  [id number]  MEDICAL RECORD NO.:  [id number]  LOCATION:  3040                         FACILITY:  [facility]  PHYSICIAN:  Hilda Lias, M.D.   DATE OF BIRTH:  Dec 06, 1945  DATE OF ADMISSION:  02/20/2011 DATE OF DISCHARGE:                             HISTORY & PHYSICAL   HISTORY OF PRESENT ILLNESS:  Mr. Busche is a gentleman who in the past underwent fusion of the L4-L5 and L5-S1.  The patient had been doing fairly well with no problem until he came to see me on January 14, 2011 complaining that he had been having more back pain with radiation to both lower extremities and although he had been going to the Pain Clinic the pain is getting worse.  The pain is mostly associated with walking activity.  It gets better when he sits and flexes lumbar spine. He had a myelogram and because of the finding he is being admitted for surgery.  PAST MEDICAL HISTORY:  He has 4 lumbar interventions, the last one had spinal cord stimulator.  ALLERGIES:  He is allergic to PENICILLIN.  MEDICATIONS:  He is taking insulin, Vytorin, Lovenox, Toprol, oxycodone.  SOCIAL HISTORY:  Negative.  FAMILY HISTORY:  Positive for cancer of the lung and diabetes.  REVIEW OF SYSTEMS:  Positive for seizures, diabetes, back pain, leg pain, vomiting, abdominal pain, high blood pressure, and high cholesterol.  PHYSICAL EXAMINATION:  GENERAL:  The patient came to my office with his wife.  He was walking with a short step.  He has difficulty sitting or standing.  When he sat, he flexes the lumbar spine. HEAD, NOSE, AND THROAT:  Normal. NECK:  Normal. LUNGS:  There is some rhonchi bilaterally. CARDIOVASCULAR:  Normal. ABDOMEN:  Normal. EXTREMITIES:  Normal pulses. NEUROLOGIC:  He has weakness in the iliopsoas and quadriceps bilaterally.  Sensation is normal.  Reflexes are 1+.  Femoral stretch maneuver is positive bilaterally.  IMAGING:  The lumbar spine x-ray and  the myelogram showed that he has fusion solid at the level of L4-L5 and L5-S1.  He has severe stenosis at the level of L3-L4 with kyphosis.  CLINICAL IMPRESSION:  Lumbar stenosis, L3-L4 status post fusion, L4-L5 and L5-S1.  RECOMMENDATION:  The patient is being admitted for surgery.  The procedure will be to rule out the previous fusion and augmented fusion from L4-L5 and to L3-L4.  He and his wife knew the risk of the surgery including no improvement whatsoever, infection, CSF leak, and need for further surgery.          ______________________________ Hilda Lias, M.D.     EB/MEDQ  D:  02/20/2011  T:  02/20/2011  Job:  [id number]  Electronically Signed by Hilda Lias M.D. on 02/26/2011 11:31:05 AM

## 2011-02-26 NOTE — Op Note (Signed)
Christian [name], CALDERA NO.:  [id number]  MEDICAL RECORD NO.:  [id number]  LOCATION:  3040                         FACILITY:  [facility]  PHYSICIAN:  Hilda Lias, M.D.   DATE OF BIRTH:  [birth year]  DATE OF PROCEDURE:  02/20/2011 DATE OF DISCHARGE:                              OPERATIVE REPORT   PREOPERATIVE DIAGNOSES:  L3-L4 stenosis with kyphosis, degenerative disk disease, neurogenic claudication.  Status post L4-5, L5-S1 fusion.  POSTOPERATIVE DIAGNOSES:  L3-L4 stenosis with kyphosis, degenerative disk disease, neurogenic claudication. Status post L4-5, L5-S1 fusion.  PROCEDURES:  L3 laminectomy, decompression of the spinal cord, foraminotomy, facetectomy, bilateral L3-4 diskectomy which are normally reduced to be able to enter 2 cages of 12 x 26, pedicle screws at the level of L3 with augmentation of the fusion from L3-4, posterolateral arthrodesis with autograft and Vitoss.  Cell Saver C-arm.  SURGEON:  Hilda Lias, MD  ASSISTANT:  Cristi Loron, MD  CLINICAL HISTORY:  Christian [name] is a 65 year old gentleman who about 6 years ago underwent fusion of the L4-5, L5-S1.  The patient had been doing really well, has no problems.  Lately, he had been complaining of back pain to both of the lower extremities which gets better with sitting and bending forward.  Myelogram showed that he has a solid fusion at the level of L4-5, L5-S1 but he has kyphosis which gets worse with flexion at the level of L3-4 with stenosis.  Because of he is getting better, we agreed with surgery.  The patient knew the risk of the surgery.  PROCEDURE IN DETAIL:  The patient was taken to the OR and after intubation, he was positioned in a prone manner.  The back was cleaned with DuraPrep.  Midline incision from the previous wire was made from the L2 which was quite kyphotic all the way down to where we found the upper part of the screws.  Retraction was made and we were able to  see the part of the spinal process of L3 as well as the lamina.  We cleared the pedicle screws of L4 bilaterally.  There was a drill, we did a laminectomy and facetectomy.  The facetectomy was coming out all the way laterally so we can enter the disk space.  This is beyond what normally do.  Lysis was accomplished.  We looked after the thecal sac first in the left side and lateral on the right side, we did a total gross diskectomy.  Then 2 cages of 12 x 26 with Vitoss and autograft were introduced, first in the left and then the right side.  Then using the C- arm, AP view and lateral view, we brought the pedicle of L3.  At the end we were able to introduce 2 screws of 6.5 x 45.  At this point, we decided not to use the Cell Saver.  We also had minimal bone loss.  Then with metal drill, we cut the rod which connected the L4-5.  The bar was removed.  Then the surgeon being at the previous scar at the level of L4, we put a rod from the view and through pedicles facing the right side and the left side  securing the rods with caps and putting a crosslink from right to left.  Then we removed the periosteum of the lateral aspect of the face of L3-L4 and a mix of Vitoss and autograft was used for arthrodesis.  The area was irrigated.  Valsalva maneuver was negative. Experall was used for local anesthesia in the muscle.  Then, the wound was closed with Vicryl and a Steri-Strip.          ______________________________ Hilda Lias, M.D.     EB/MEDQ  D:  02/20/2011  T:  02/21/2011  Job:  [id number]  Electronically Signed by Hilda Lias M.D. on 02/26/2011 11:31:43 AM

## 2011-02-27 LAB — GLUCOSE, CAPILLARY
Glucose-Capillary: 168 mg/dL — ABNORMAL HIGH (ref 70–99)
Glucose-Capillary: 153 mg/dL — ABNORMAL HIGH (ref 70–99)

## 2011-02-27 LAB — URINE CULTURE: Special Requests: NEGATIVE

## 2011-02-28 LAB — GLUCOSE, CAPILLARY
Glucose-Capillary: 124 mg/dL — ABNORMAL HIGH (ref 70–99)
Glucose-Capillary: 93 mg/dL (ref 70–99)

## 2011-02-28 NOTE — Discharge Summary (Signed)
  [name], Christian [name] NO.:  [id number]  MEDICAL RECORD NO.:  [id number]  LOCATION:  3040                         FACILITY:  [facility]  PHYSICIAN:  Hilda Lias, M.D.   DATE OF BIRTH:  April 15, 1946  DATE OF ADMISSION:  02/20/2011 DATE OF DISCHARGE:  02/25/2011                              DISCHARGE SUMMARY   ADMISSION DIAGNOSES:  L3-L4 stenosis with kyphosis, status post fusion of L4-5, 5-1.  FINAL DIAGNOSES:  L3-L4 stenosis with kyphosis, status post fusion of L4- 5, 5-1.  CLINICAL HISTORY:  Mr. Gaillard is a gentleman who about 7 years ago underwent fusion of the L4-5, 5-1.  He had been doing fairly well and lately, he had been complaining of back pain worsened to both lower extremity.  The only thing, he can get relief of the pain is when he bend forward.  X-rays showed that he has severe stenosis at the L3-4 with the degenerative disk disease and kyphosis because of the pain medication, has not been of any help.  Surgery was advised.  Laboratory normal.  COURSE IN THE HOSPITAL:  The patient was taken to surgery _new fusion_________ was done using new set of pedicle screws and new _cages_________.  At surgery, the patient complained of quite bit of pain.  The patient had been used a lot of pain medication.  At the end, Fentanyl was used.  It did help him.  He was seen by the rehab unit who felt that he was excellent candidate.  The patient was transferred to the rehab unit on February 25, 2011.  CONDITION AT DISCHARGE:  Stable.  MEDICATION:  Continue with the same medication.  DIET:  Same diet.  ACTIVITY:  Up to rehab.  FOLLOWUP:  I will see Mr. Arocho while he is in the hospital later on my office.          ______________________________ Hilda Lias, M.D.     EB/MEDQ  D:  02/26/2011  T:  02/26/2011  Job:  [id number]  Electronically Signed by Hilda Lias M.D. on 02/28/2011 11:27:51 AM

## 2011-03-01 DIAGNOSIS — M4716 Other spondylosis with myelopathy, lumbar region: Secondary | ICD-10-CM

## 2011-03-01 LAB — GLUCOSE, CAPILLARY: Glucose-Capillary: 125 mg/dL — ABNORMAL HIGH (ref 70–99)

## 2011-03-02 DIAGNOSIS — M4716 Other spondylosis with myelopathy, lumbar region: Secondary | ICD-10-CM

## 2011-03-02 LAB — GLUCOSE, CAPILLARY
Glucose-Capillary: 149 mg/dL — ABNORMAL HIGH (ref 70–99)
Glucose-Capillary: 135 mg/dL — ABNORMAL HIGH (ref 70–99)
Glucose-Capillary: 132 mg/dL — ABNORMAL HIGH (ref 70–99)
Glucose-Capillary: 68 mg/dL — ABNORMAL LOW (ref 70–99)
Glucose-Capillary: 119 mg/dL — ABNORMAL HIGH (ref 70–99)

## 2011-03-03 LAB — GLUCOSE, CAPILLARY: Glucose-Capillary: 71 mg/dL (ref 70–99)

## 2011-03-04 DIAGNOSIS — M4716 Other spondylosis with myelopathy, lumbar region: Secondary | ICD-10-CM

## 2011-03-04 LAB — GLUCOSE, CAPILLARY: Glucose-Capillary: 104 mg/dL — ABNORMAL HIGH (ref 70–99)

## 2011-03-05 DIAGNOSIS — M4716 Other spondylosis with myelopathy, lumbar region: Secondary | ICD-10-CM

## 2011-03-05 LAB — GLUCOSE, CAPILLARY
Glucose-Capillary: 113 mg/dL — ABNORMAL HIGH (ref 70–99)
Glucose-Capillary: 135 mg/dL — ABNORMAL HIGH (ref 70–99)
Glucose-Capillary: 143 mg/dL — ABNORMAL HIGH (ref 70–99)

## 2011-03-05 NOTE — H&P (Signed)
Christian, [name] NO.:  [id number]  MEDICAL RECORD NO.:  [id number]  LOCATION:  4033                         FACILITY:  [facility]  PHYSICIAN:  Erick Colace, M.D.DATE OF BIRTH:  04-Nov-1945  DATE OF ADMISSION:  02/25/2011 DATE OF DISCHARGE:                             HISTORY & PHYSICAL   REASON FOR ADMISSION:  Lumbar stenosis status post fusion.  HISTORY OF PRESENT ILLNESS:  A 65 year old male with prior history of multiple back surgeries, chronic pain syndrome spinal cord stimulator who started having increased back pain with radiation to bilateral lower extremities.  Imaging study showed severe lumbar stenosis at L3-4 with some kyphosis.  The patient was admitted on February 20, 2011, for L3 laminectomy, decompression, bilateral L3-4 fusion per Dr. Hilda Lias.  His previous surgery was at L4-5 and L5-S1 with fusions at these levels.  Postoperative pain management issues.  Preoperatively, hehad been on narcotic analgesics.  He started PT and OT postoperatively, but due to poor progression, Physical Medicine Rehabilitation was consulted.  The patient was felt to be a good inpatient rehabilitation candidate after consult on February 22, 2011, and arrangements were made for admission to inpatient rehab.  PAST SURGICAL HISTORY:  Right L4-5 discectomy in 2004, done twice, left L4-5 as well as L5-S1 decompression 2005.  Other past surgical history cystoscopy 2007.  OTHER PAST MEDICAL HISTORY: 1. Chronic pain syndrome. 2. Diabetes type 2. 3. Diverticulosis.  FAMILY HISTORY:  CHF, diabetes, and lung cancer.  SOCIAL HISTORY:  Independent.  Married.  Prior to admission used to play tennis, but had not played tennis or golf due to back pain.  One-level home, 2-6 steps to enter.  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  He has had no alcohol for a number of years.  He quit tobacco 30 years ago.  PRIOR FUNCTIONAL HISTORY:   Independent and driving.  HOME MEDICATIONS: 1. Lactulose 15-30 mg daily. 2. Insulin 75/25 b.i.d. 3. TriCor 145 mg daily. 4. Altace 10 mg p.o. daily. 5. Keppra 1000 mg b.i.d. along with 250 b.i.d. for a total of 1250     b.i.d. 6. Xanax 0.25 mg b.i.d. 7. Toprol-XL 200 mg p.o. daily. 8. Clonidine 0.3 mg b.i.d. 9. Vytorin 10/20 p.o. daily. 10.Norvasc 10 mg daily. 11.Janumet 50/1000 b.i.d. 12.Aspirin 81 mg daily. 13.Omeprazole 20 mg p.o. daily. 14.Reglan 5 mg t.i.d.  LABORATORY DATA:  Last hemoglobin 13.2 on February 15, 2011.  White count 8.2, platelets 266,000, BUN 17, creatinine 1.04.  Sodium 139, potassium 4.9.  PHYSICAL EXAMINATION:  VITAL SIGNS:  Blood pressure 106/68, pulse 95, respirations 18, temp 98.4. GENERAL:  Sedated male in no acute distress. EYES:  Anicteric, noninjected. EXTERNAL ENT:  Negative.  He has multiple dental caries on oral exam. NECK:  Supple without adenopathy. BACK:  Midline incision clean and dry.  Had some bloody drainage on previous dressing but this has subsided.  He is awake, followingcommands. LUNGS:  Clear. HEART:  Regular rate and rhythm.  No rubs, murmurs, or extra sounds. EXTREMITIES:  Without edema. ABDOMEN:  Positive bowel sounds, nontender to palpation. NEURO:  Motor strength 5/5 bilateral deltoid, biceps, triceps, grip, finger flexors, 4/5 hip  flexors, quad, TA and gastroc. PSYCH:  Mood and affect appropriate.  Orientation x3.  Sensation is reduced bilateral S1 dermatomal distribution, otherwise, intact. Cranial nerves II-XII are normal. SKIN:  As noted above no breakdown on heels.  POST ADMISSION PHYSICIAN EVALUATION: 1. Functional deficits secondary to lumbar stenosis and radiculopathy,     the fusion extended to L3-4. 2. The patient admitted to receive collaborative interdisciplinary     care between the physiatrist, rehab nursing staff, and therapy     team. 3. The patient's level of medical complexity and substantial therapy      needs in context of that medical necessity cannot be provide at a     lesser intensity of care. 4. The patient has experienced functional loss from his baseline which     is significant.  Upon functional assessment at the time of     preadmission screening, the patient was at a mod assist with bed     mobility, ambulation transfers not tested yet.  Upon functional     assessment today, the patient is at min assist bed mobility, min     assist transfers, min assist rolling walker ambulation 50 feet, min     assist upper body dressing and total assist lower body dressing.     Judging by the patient's physical exam, diagnosis and functional     history, the patient has displayed the ability to make functional     progress, which will result in measurable gains while in inpatient     rehab.  These gains will be of substantial and practical use upon     discharge to home in facilitating mobility, self-care, and     independence.  Interim change in medical status since preadmission     screening are detailed in the history of present illness. 5. Physiatrist will provide 24-hour management of medical needs as     well as oversight of therapy plan/treatment and provide guidance     appropriate regarding interactions of the two. 6. A 24-hour rehab nursing will assess in the management of skin,     bowel, bladder, and help integrate therapy concepts, techniques and     education. 7. PT will assess and treat for pre gait training, gait training,     endurance, safety, now with back precautions.  Goals are for a     modified independent level with mobility. 8. OT will assess and treat for ADLs, safety with back precautions     during ADLs, equipment, endurance.  Goals are for a modified     independent level with upper body and lower body ADLs. 9. Case Management and Social Work will assess and treat for     psychosocial issues and discharge planning. 10.Team conference will be held weekly to  assess the patient     progress/goals to determine barriers to discharge. 11.The patient has demonstrated sufficient medical stability and     exercise capacity to tolerate at least 3 hours of therapy per day     at least 5 days per week. 12.Estimated length of stay is 7-10 days.  Prognosis for further     functional improvement is good.  MEDICAL PROBLEM LIST AND PLAN: 1. DVT prophylaxis SCD. 2. Type 2 DM 70/30 insulin b.i.d., Januvia and metformin. 3. Chronic insomnia sleep wake, start Xanax at bedtime, may need     trialing of other medications as well. 4. Chronic pain syndrome, lumbar post laminectomy syndrome.  Start  OxyContin CR with OxyIR 10 mg q.4 h. p.r.n. 5. Constipation.  Soap suds enema, Senokot S, Reglan, as well as     lactulose.  Motivation and mood appear to be adequate for full participation in inpatient rehabilitation program.  Rehab Medicine Services explained to both the patient and his wife.  Questions answered.     Erick Colace, M.D.     AEK/MEDQ  D:  02/25/2011  T:  02/26/2011  Job:  [id number]  cc:   Foye Deer, MD Hilda Lias, M.D. Kathrin Penner. Vear Clock, M.D. Vesta Mixer, M.D.  Electronically Signed by Claudette Laws M.D. on 03/05/2011 12:35:55 PM

## 2011-03-10 NOTE — Discharge Summary (Signed)
NAMELUQMAN, PERRELLI NO.:  [id number]  MEDICAL RECORD NO.:  [id number]  LOCATION:  4033                         FACILITY:  [facility]  PHYSICIAN:  Ranelle Oyster, M.D.DATE OF BIRTH:  1946-04-19  DATE OF ADMISSION:  02/25/2011 DATE OF DISCHARGE:  03/06/2011                              DISCHARGE SUMMARY   DISCHARGE DIAGNOSES: 1. 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 2. Diabetes mellitus type 2. 3. Chronic insomnia. 4. Chronic pain. 5. Acute-on-chronic constipation, improved. 6. Tachycardia, resolved.  HISTORY OF PRESENT ILLNESS:  Mr. Christian [name] is a 65 year old male with history of multiple back surgeries with chronic pain and spinal cord stimulator.  He was initially started developing increase in back pain with radiation to bilateral lower extremities.  X-rays done revealed severe lumbar stenosis at L3-L4 with kyphosis.  The patient was admitted on February 20, 2011, for L3 laminectomy with decompression bilateral L3-L4 with fusion by Dr. Jeral Fruit.  Postop, has had issues with pain management.  He is also noted to have a poor safety with decreased posture with therapy.  Noted to have difficulty recoiling back precautions.  The patient was evaluated by Rehab and we felt that he would benefit from a CIR program.  PAST MEDICAL HISTORY:  Significant for: 1. Right L4-L5 diskectomy in 2004 x2, left L4-L5 and L5-S1     decompression in 2005. 2. Right hydronephrosis at cystoscopy in 2007. 3. Chronic pain. 4. Chronic insomnia. 5. DM type 2. 6. Diverticulosis.  ALLERGIES:  PENICILLIN.  REVIEW OF SYSTEMS:  Positive for reflux with nausea, wound care issues, history of pain as well as poor sleep.  FAMILY HISTORY:  Positive for CHF, diabetes, and lung cancer.  SOCIAL HISTORY:  The patient is married, was independent prior to admission.  Lives in 1 level home with 2-6 steps at  entry.  He is a disabled Chartered loss adjuster.  Quit tobacco x30 years.  Has not used any alcohol in years.  Wife is supportive and can assist past discharge.  FUNCTIONAL HISTORY:  The patient was independent prior to admission.  He still drives.  FUNCTIONAL STATUS:  The patient is min-assist transfers, min-assist ambulating 54 feet with rolling walker, min-assist upper body care, max- to-total-assist lower body care.  PHYSICAL EXAMINATION:  VITAL SIGNS:  Blood pressure 106/68, pulse 95, respirations 18, temperature 98.4. GENERAL:  The patient is a well-nourished, well-developed sedated male in no acute distress. HEENT:  Eyes anicteric, noninjected.  Oral mucosa is pink and moist. Multiple dental caries on exam. NECK:  Supple without JVD or lymphadenopathy.  Midline incision is intact with Steri-Strips in place.  He is noted to have moderate amount of dark bloody drainage on his dressing from lower part of his incision. LUNGS:  Clear to auscultation bilaterally. HEART:  Shows regular rate and rhythm without murmurs, gallops, or rubs. ABDOMEN:  Soft and nontender with positive bowel sounds. EXTREMITIES:  Showed no evidence of clubbing, cyanosis, or edema. NEUROLOGIC:  The patient is alert and oriented x3.  Mood and affect are appropriate.  The sensation is reduced bilateral S1 dermatomal distribution, otherwise intact.  Cranial nerves II-XII normal.  Motor strength is 5/5 bilateral deltoids, biceps, triceps, grip and finger flexors, 4/5 hip flexors, quad, TA and gastroc.  HOSPITAL COURSE:  Mr. Christian [name] was admitted to rehab on February 25, 2011, for inpatient therapies to consist of PT, OT at least 3 hours 5 days a week.  Past-admission, physiatrist, rehab, RN, and therapy team have worked together to provide customized collaborative interdisciplinary care.  Rehab RN has worked with the patient on bowel and bladder program.  His sleep-wake cycle was also monitored, and attempts at  treating his chronic insomnia were initiated. Pain management was initiated with addition of OxyContin CR at 10 mg b.i.d. and slowly titrated to 20 mg in a.m., 30 mg at bedtime.  OxyIR 50 mg q.4 hours p.r.n. breakthrough pain, in addition lidocaine patches were added to back to help with his pain symptomatology.  He continued to have issues with insomnia.  He was changed over to Restoril prior to discharge and reports great relief with this.  The patient's back incision has been monitored on b.i.d. basis.  Bloody drainage from the back has resolved by time of discharge.  Labs were done past admission revealing H and H at 12.7 and 37.1, white count 9.7, platelets 398.  Check of lytes revealed sodium 136, potassium 3.9, chloride 96, CO2 27, BUN 16, creatinine 0.77, glucose 195.  A UA/UC was done past admission and UA was negative and urine culture showed 5000 colonies insignificant growth.  The patient's diabetes was monitored with AC and at bedtime basis.  Nutritional supplements were added to help the patient maintain his nutritional status due to poor p.o. intake initially.  Blood sugars at time of discharge are ranging from 110s to an occasional high in 140s.  The patient was initially noted to have some tachycardia in part due to deconditioning.  EKG done, showed sinus tachycardia.  Heart rate at time of discharge is at 80-90 range.  During the patient's stay in rehab, weekly team conferences were held to monitor the patient's progress, set goals, as well as discuss barriers to discharge.  At time of admission, the patient was limited by impaired gait, decreased balance, decreased activity tolerance.  He was required total-assist for transfers mod-assist for ambulating a short distances. He has made great progress during his stay with improvement in functional mobility, gait and transfers.  Currently, he is supervision for all transfers and mobility.  The patient and wife have been  educated regarding supervision needed with all functional mobility tasks.  OT has worked with the patient on self-care tasks.  At admission, the patient with limitations due to severe acute pain in his back, generalized weakness as well as increased sensitivity in back and legs with decrease in standing tolerance, requiring bilateral upper extremity support.  He required max-assist for ADL task.  He has made steady progress during this admission and currently progressed to being at supervision level for bathing, upper and lower body dressing, toileting and shower transfers.  The patient continues to require min-verbal cues for safety awareness and following his back precautions.  Wife has been educated regarding safety with ADLs and no further OT needs at this time.  On March 06, 2011, the patient is discharged to home.  DISCHARGE MEDICATIONS: 1. Analgesic balm to neck t.i.d. p.r.n. 2. Lidocaine patches to back on at 7 a.m. and off 7 p.m. daily. 3. Robaxin 500 mg p.o. q.i.d. p.r.n. spasms. 4. Multivitamin 1 per day. 5. OxyIR 10 mg 1 to 1-1/2 tablet p.o. q.4  hours p.r.n. moderate-to-     severe pain, #120 Rx. 6. Restoril 15 mg 1 p.o. at bedtime p.r.n. insomnia. 7. OxyContin 10 mg use 2 pills at 8 a.m. and 3 pills at 8 p.m. for 1     week, then decrease to 2 pills at 8 a.m. and 8 p.m. x1 week, then     decrease to 1 at 8 a.m. and 2 at 8 p.m. for 1 week, then decrease     to 1 p.o. b.i.d. till gone, #100 Rx. 8. Senokot-S 2 p.o. at bedtime. 9. Humalog 75/25 25 units subcu b.i.d. 10.Lactulose 30 mL p.o. per day. 11.Altace 10 mg a day. 12.Aspirin 81 mg a day. 13.Clarinex 5 mg a day. 14.Clonidine 0.3 mg b.i.d. 15.Flonase 1 squirt each nostril b.i.d. 16.Janumet 50-1000 1 p.o. b.i.d. 17.Keppra 1250 mg b.i.d. 18.Reglan 5 mg a.c. and at bedtime. 19.Norvasc 10 mg a day. 20.Omeprazole 20 mg a day. 21.Sarna lotion q.i.d. p.r.n. itching. 22.Toprol-XL 200 mg a day. 23.TriCor 145 mg a  day. 24.Vicks decongestion b.i.d. p.r.n. congestion. 25.Vytorin 10-10 1 p.o. per day.  Diet is carb modified medium.  SPECIAL INSTRUCTIONS:  Do not use Xanax and does not use any Endocet. [facility] to provide physical therapy.  Activity level is 24-hour supervision.  No strenuous activity, no driving, walk with walker and with supervision.  FOLLOWUP:  The patient to follow up with Dr. Riley Kill as needed.  Follow up with Dr. Jeral Fruit for postop check in 2 weeks.  Follow up with Thyra Breed for routine check in 3-4 weeks.  Follow up with Gaye Alken, nurse practitioner in 2 weeks.     Delle Reining, P.A.   ______________________________ Ranelle Oyster, M.D.    PL/MEDQ  D:  03/06/2011  T:  03/07/2011  Job:  [id number]  cc:   Hilda Lias, M.D. Gaye Alken, NP Mark L. Vear Clock, M.D.  Electronically Signed by Osvaldo Shipper. on 03/08/2011 03:36:44 PM Electronically Signed by Faith Rogue M.D. on 03/10/2011 07:58:36 PM

## 2011-08-06 ENCOUNTER — Other Ambulatory Visit: Payer: Self-pay | Admitting: Cardiovascular Disease

## 2011-08-28 ENCOUNTER — Other Ambulatory Visit: Payer: Self-pay | Admitting: Cardiovascular Disease

## 2011-09-10 ENCOUNTER — Other Ambulatory Visit: Payer: Self-pay | Admitting: Cardiovascular Disease

## 2011-09-10 NOTE — Telephone Encounter (Signed)
Fax Received. Refill Completed. Christian [name] (R.M.A)   

## 2011-12-18 ENCOUNTER — Other Ambulatory Visit: Payer: Self-pay | Admitting: Cardiovascular Disease

## 2011-12-18 NOTE — Telephone Encounter (Signed)
Pt needs appointment then refill can be made Fax Received. Refill Completed. Christian [name] (R.M.A)   

## 2012-01-09 ENCOUNTER — Ambulatory Visit (INDEPENDENT_AMBULATORY_CARE_PROVIDER_SITE_OTHER): Payer: Medicare Other | Admitting: Cardiovascular Disease

## 2012-01-09 ENCOUNTER — Encounter: Payer: Self-pay | Admitting: Cardiovascular Disease

## 2012-01-09 VITALS — BP 126/81 | HR 86 | Ht 67.5 in | Wt 176.0 lb

## 2012-01-09 DIAGNOSIS — I10 Essential (primary) hypertension: Secondary | ICD-10-CM

## 2012-01-09 NOTE — Progress Notes (Signed)
Christian [name] Date of Birth  [birth year]       [facility] Office [address]  [address] [place], [place]  [address]   [place], [place]  [address] [phone]     [phone]   Fax  [phone]    Fax [phone]  Problem List: 1. Hypertension 2. Hypertensive cardiomyopathy 3. Back pain  History of Present Illness: 66 year old gentleman with a history of hypertension and hypertensive cardiopathy. He's having lots of problems with back pain and sees Dr. Vear Clock.  He's able to walk fairly short distances because of his back pain. He denies any cardiac problems with his exercise.  He has had back surgery since I last saw him.  He has not noticed any significant improvement in his functional capacity.    Current Outpatient Prescriptions on File Prior to Visit  Medication Sig Dispense Refill  . ALPRAZolam (XANAX) 1 MG tablet Take 1 mg by mouth as needed.        Marland Kitchen amLODipine (NORVASC) 10 MG tablet Take 1 tablet (10 mg total) by mouth daily.  90 tablet  3  . aspirin 81 MG tablet Take 81 mg by mouth daily.        . cloNIDine (CATAPRES) 0.3 MG tablet TAKE 1 TABLET BY MOUTH TWICE DAILY.  180 tablet  0  . ezetimibe-simvastatin (VYTORIN) 10-10 MG per tablet Take 1 tablet by mouth daily.        . fenofibrate (TRICOR) 145 MG tablet TAKE 1 TABLET ONCE DAILY.  30 tablet  5  . insulin lispro (HUMALOG) 100 UNIT/ML injection Inject 20-25 Units into the skin 2 (two) times daily.       Marland Kitchen LACTULOSE PO Take by mouth. Taking 1-2 Tablespoon Daily       . levETIRAcetam (KEPPRA) 250 MG tablet Take 250 mg by mouth daily. 3 Tablets BID Daily      . metoprolol (TOPROL-XL) 200 MG 24 hr tablet TAKE 1 TABLET ONCE DAILY  30 tablet  5  . oxyCODONE (OXYCONTIN) 15 MG TB12 Take 15 mg by mouth as needed.       . potassium chloride (KLOR-CON) 20 MEQ packet Take 20 mEq by mouth daily.        . ramipril (ALTACE) 10 MG capsule TAKE 1 CAPSULE DAILY.  90  capsule  0  . sitaGLIPtan-metformin (JANUMET) 50-1000 MG per tablet Take 1 tablet by mouth 2 (two) times daily.          Allergies  Allergen Reactions  . Penicillins Other (See Comments)    Causes weakness     Past Medical History  Diagnosis Date  . Diabetes mellitus   . Hypertension   . Hypertensive cardiomyopathy   . Peptic ulcer disease   . Back pain   . Diverticulitis     Past Surgical History  Procedure Date  . Back surgery     x3    History  Smoking status  . Former Smoker  . Quit date: 12/23/1968  Smokeless tobacco  . Not on file    History  Alcohol Use No    Family History  Problem Relation Age of Onset  . Heart failure Mother   . Cancer Father   . Diabetes Brother     Reviw of Systems:  Reviewed in the HPI.  All other systems are negative.  Physical Exam: Blood pressure 126/81, pulse 86, height 5' 7.5" (1.715 m), weight 176 lb (  79.833 kg), SpO2 97.00%. General: Well developed, well nourished, in no acute distress.  Head: Normocephalic, atraumatic, sclera non-icteric, mucus membranes are moist,   Neck: Supple. Carotids are 2 + without bruits. No JVD  Lungs: Clear bilaterally to auscultation.  Heart: regular rate.  normal  S1 S2. No murmurs, gallops or rubs.  Abdomen: Soft, non-tender, non-distended with normal bowel sounds. No hepatomegaly. No rebound/guarding. No masses.  Msk:  Strength and tone are normal  Extremities: No clubbing or cyanosis. No edema.  Distal pedal pulses are 2+ and equal bilaterally.  Neuro: Alert and oriented X 3. Moves all extremities spontaneously.  Psych:  Responds to questions appropriately with a normal affect.  ECG: 01/09/2012-normal sinus rhythm at 90 beats a minute. He has occasional premature supraventricular complexes. Otherwise the EKG is normal.  Assessment / Plan:

## 2012-01-09 NOTE — Assessment & Plan Note (Signed)
Christian [name] is doing well from a cardiac standpoint.  He continues to have lots of problems with his back issues. He had surgery this past year but it really has not helped him.  We'll continue with his current blood pressure medications.

## 2012-01-09 NOTE — Patient Instructions (Addendum)
**Note De-identified Viktorya Arguijo Obfuscation** Your physician wants you to follow-up in: 1 year  You will receive a reminder letter in the mail two months in advance. If you don't receive a letter, please call our office to schedule the follow-up appointment.  Your physician recommends that you continue on your current medications as directed. Please refer to the Current Medication list given to you today.  

## 2012-02-10 ENCOUNTER — Other Ambulatory Visit: Payer: Self-pay | Admitting: Cardiovascular Disease

## 2012-02-10 NOTE — Telephone Encounter (Signed)
Fax Received. Refill Completed. Christian [name] (R.M.A)   

## 2012-03-10 ENCOUNTER — Other Ambulatory Visit: Payer: Self-pay | Admitting: Cardiovascular Disease

## 2012-03-10 NOTE — Telephone Encounter (Signed)
Fax Received. Refill Completed. Christian [name] (R.M.A)   

## 2012-03-23 ENCOUNTER — Other Ambulatory Visit: Payer: Self-pay | Admitting: *Deleted

## 2012-03-23 MED ORDER — RAMIPRIL 10 MG PO CAPS: 10.0000 mg | ORAL_CAPSULE | Freq: Every day | ORAL | Status: DC

## 2012-03-23 NOTE — Telephone Encounter (Signed)
Fax Received. Refill Completed. Alyx Mcguirk Chowoe (R.M.A)   

## 2012-03-23 NOTE — Telephone Encounter (Signed)
Opened in Error.

## 2012-04-13 ENCOUNTER — Other Ambulatory Visit: Payer: Self-pay | Admitting: *Deleted

## 2012-04-13 MED ORDER — METOPROLOL SUCCINATE ER 200 MG PO TB24: 200.0000 mg | ORAL_TABLET | Freq: Every day | ORAL | Status: DC

## 2012-04-13 MED ORDER — CLONIDINE HCL 0.3 MG PO TABS: 0.3000 mg | ORAL_TABLET | Freq: Two times a day (BID) | ORAL | Status: DC

## 2012-04-13 NOTE — Telephone Encounter (Signed)
Fax Received. Refill Completed. Christian [name] (R.M.A)   

## 2012-04-13 NOTE — Telephone Encounter (Signed)
Fax Received. Refill Completed. Justus Droke Chowoe (R.M.A)   

## 2012-07-28 IMAGING — CR DG CHEST 2V
2 series · 2 of 2 positions shown · non-contrast
Comparison: 11/09/2003.

CLINICAL DATA: Preop respiratory evaluation for lumbar spine
surgery.

CHEST - 2 VIEW

[view not recorded (1 of 2)]
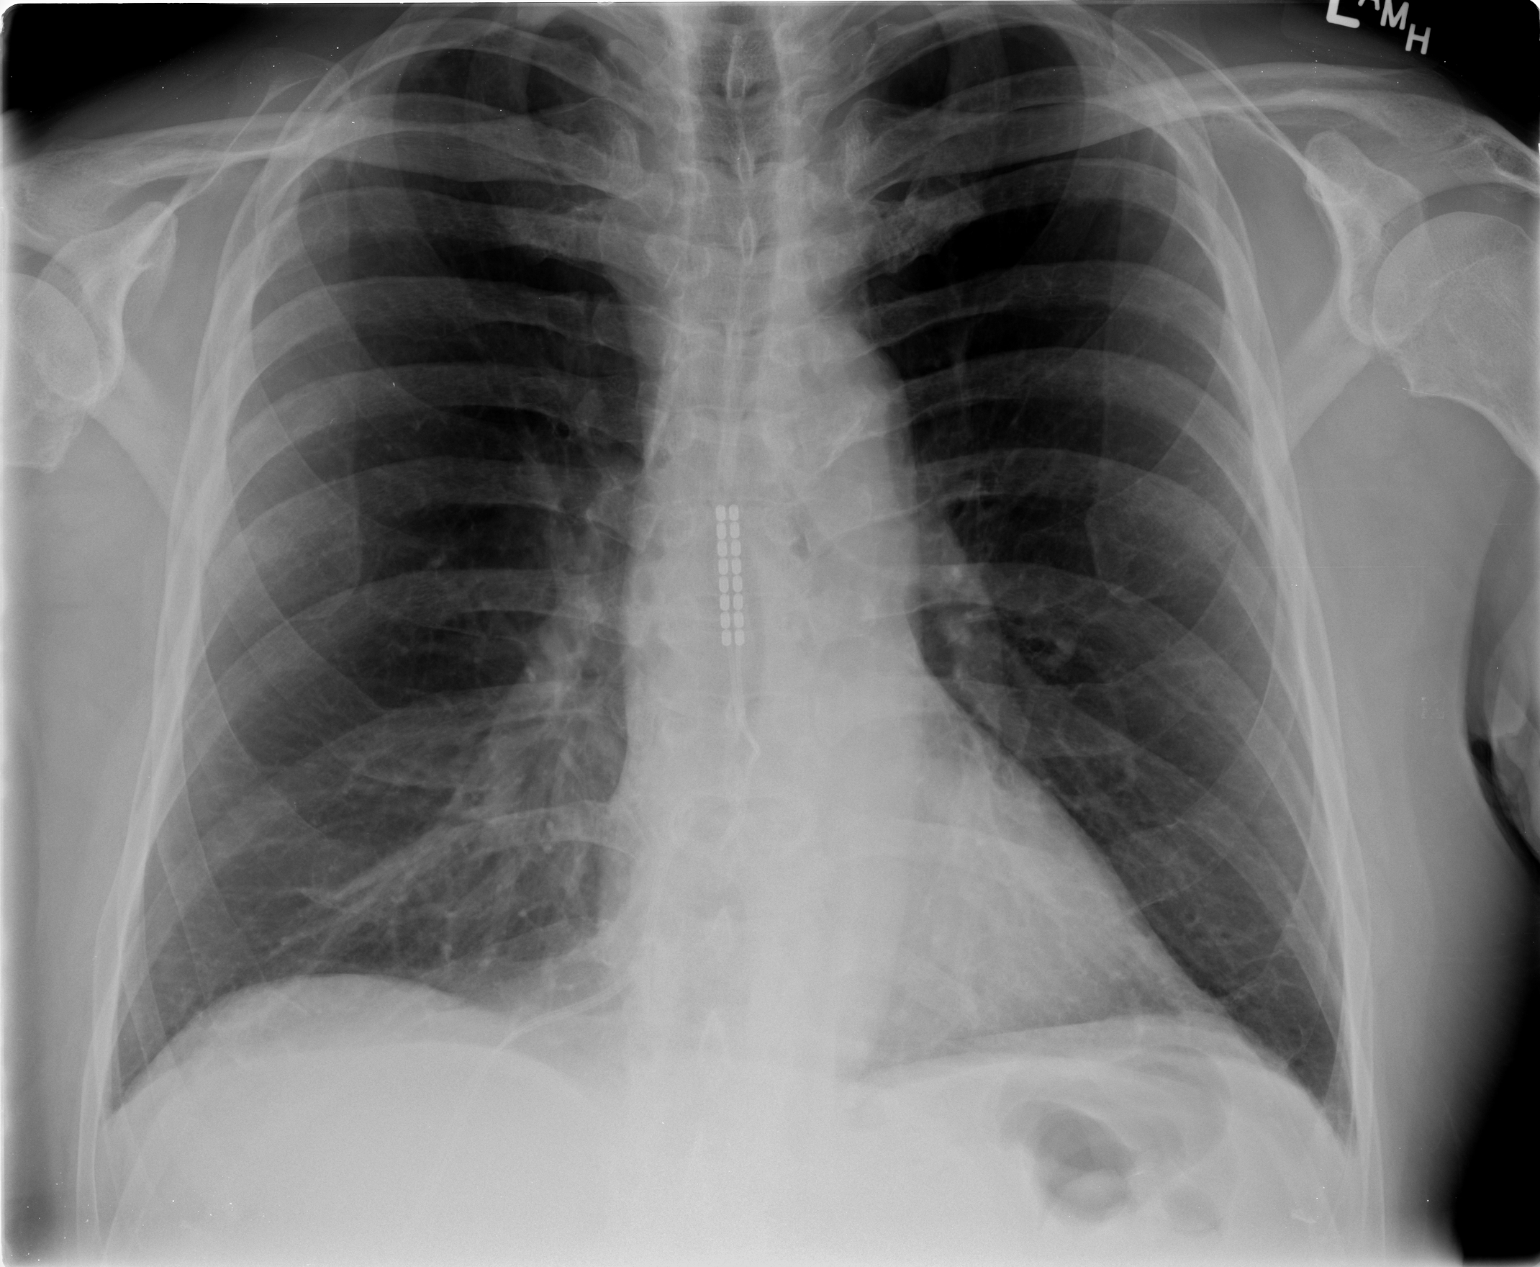

[view not recorded (2 of 2)]
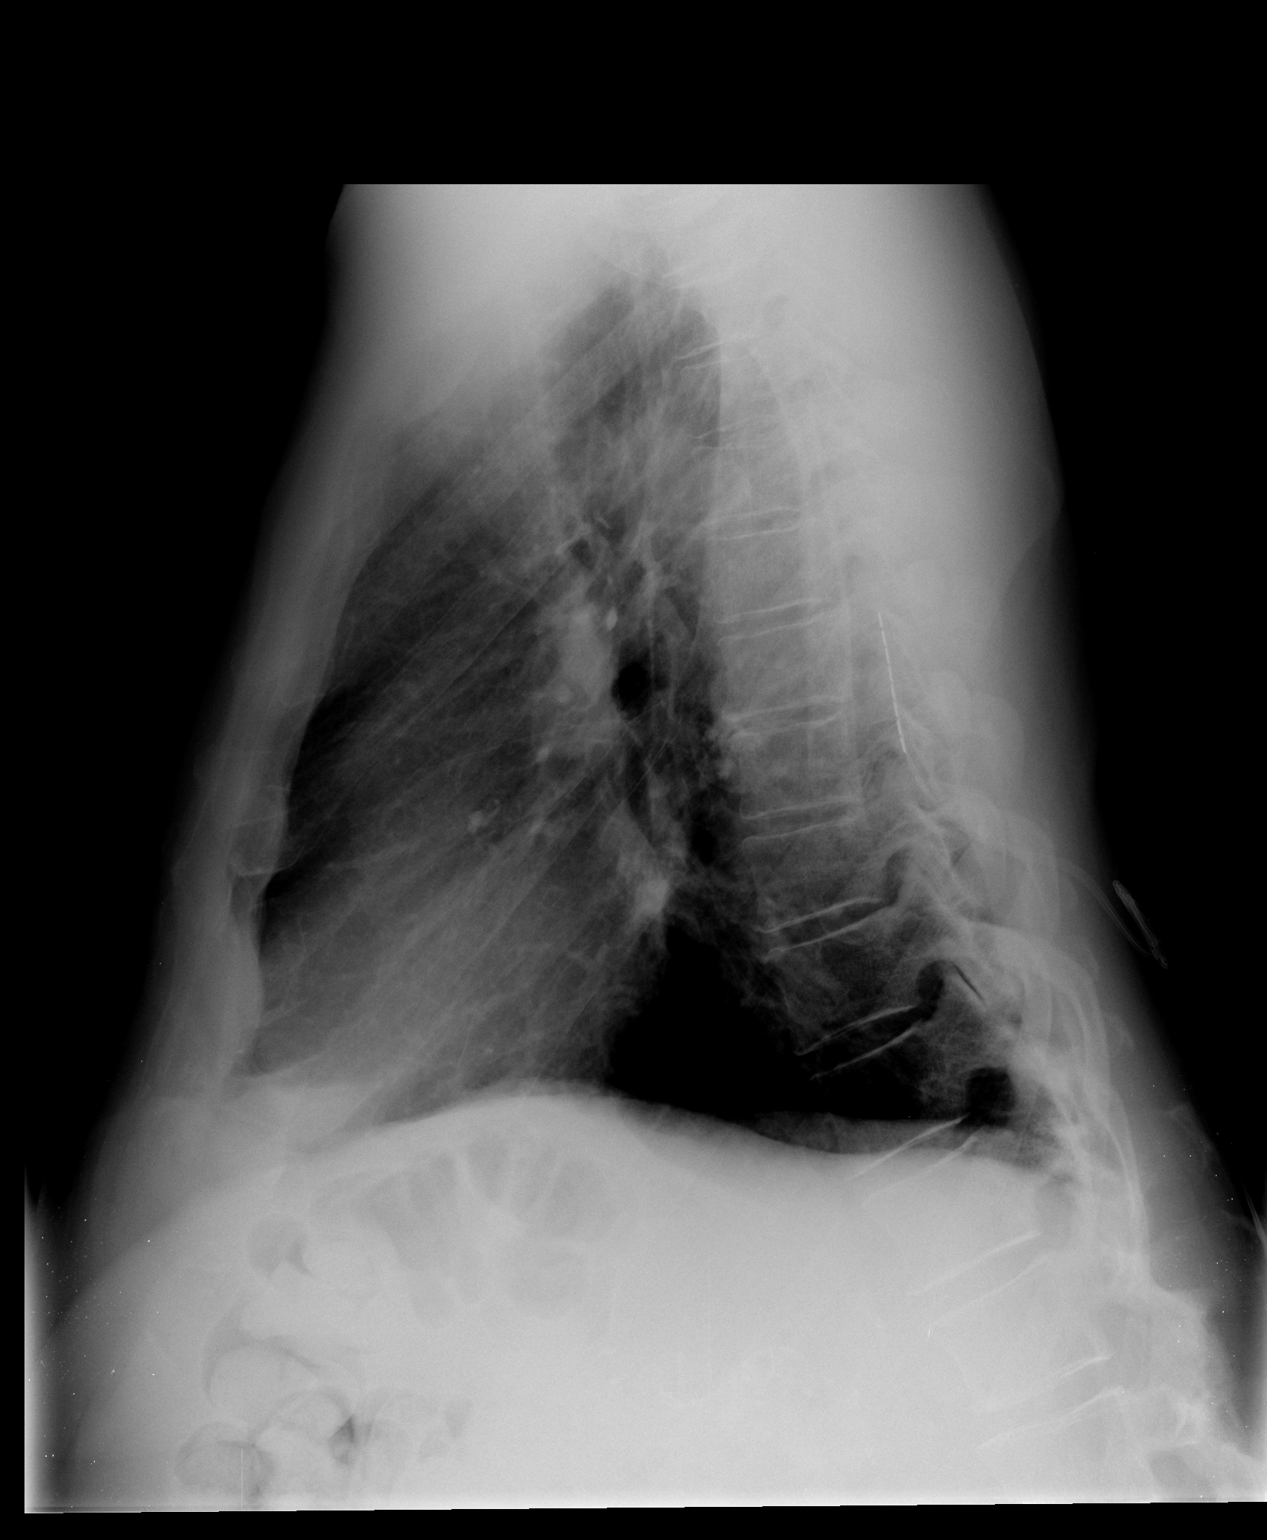

[2 of 2 positions shown; findings below may reference images not displayed]

FINDINGS: The extreme lung apices have not been included on the
frontal film.  Both lungs are hyperexpanded consistent with
emphysema.  No focal airspace consolidation or pulmonary edema.
Spinal stimulator leads overlie the mid thoracic canal. Imaged bony
structures of the thorax are intact.
IMPRESSION: Emphysema without acute cardiopulmonary process.

## 2012-08-02 IMAGING — CR DG LUMBAR SPINE 1V
1 series · 1 of 1 positions shown · non-contrast
Comparison: CT lumbar spine 02/05/2011.

CLINICAL DATA: L3-4 fusion with removal of hardware at L4-5.

LUMBAR SPINE - 1 VIEW

[view not recorded]
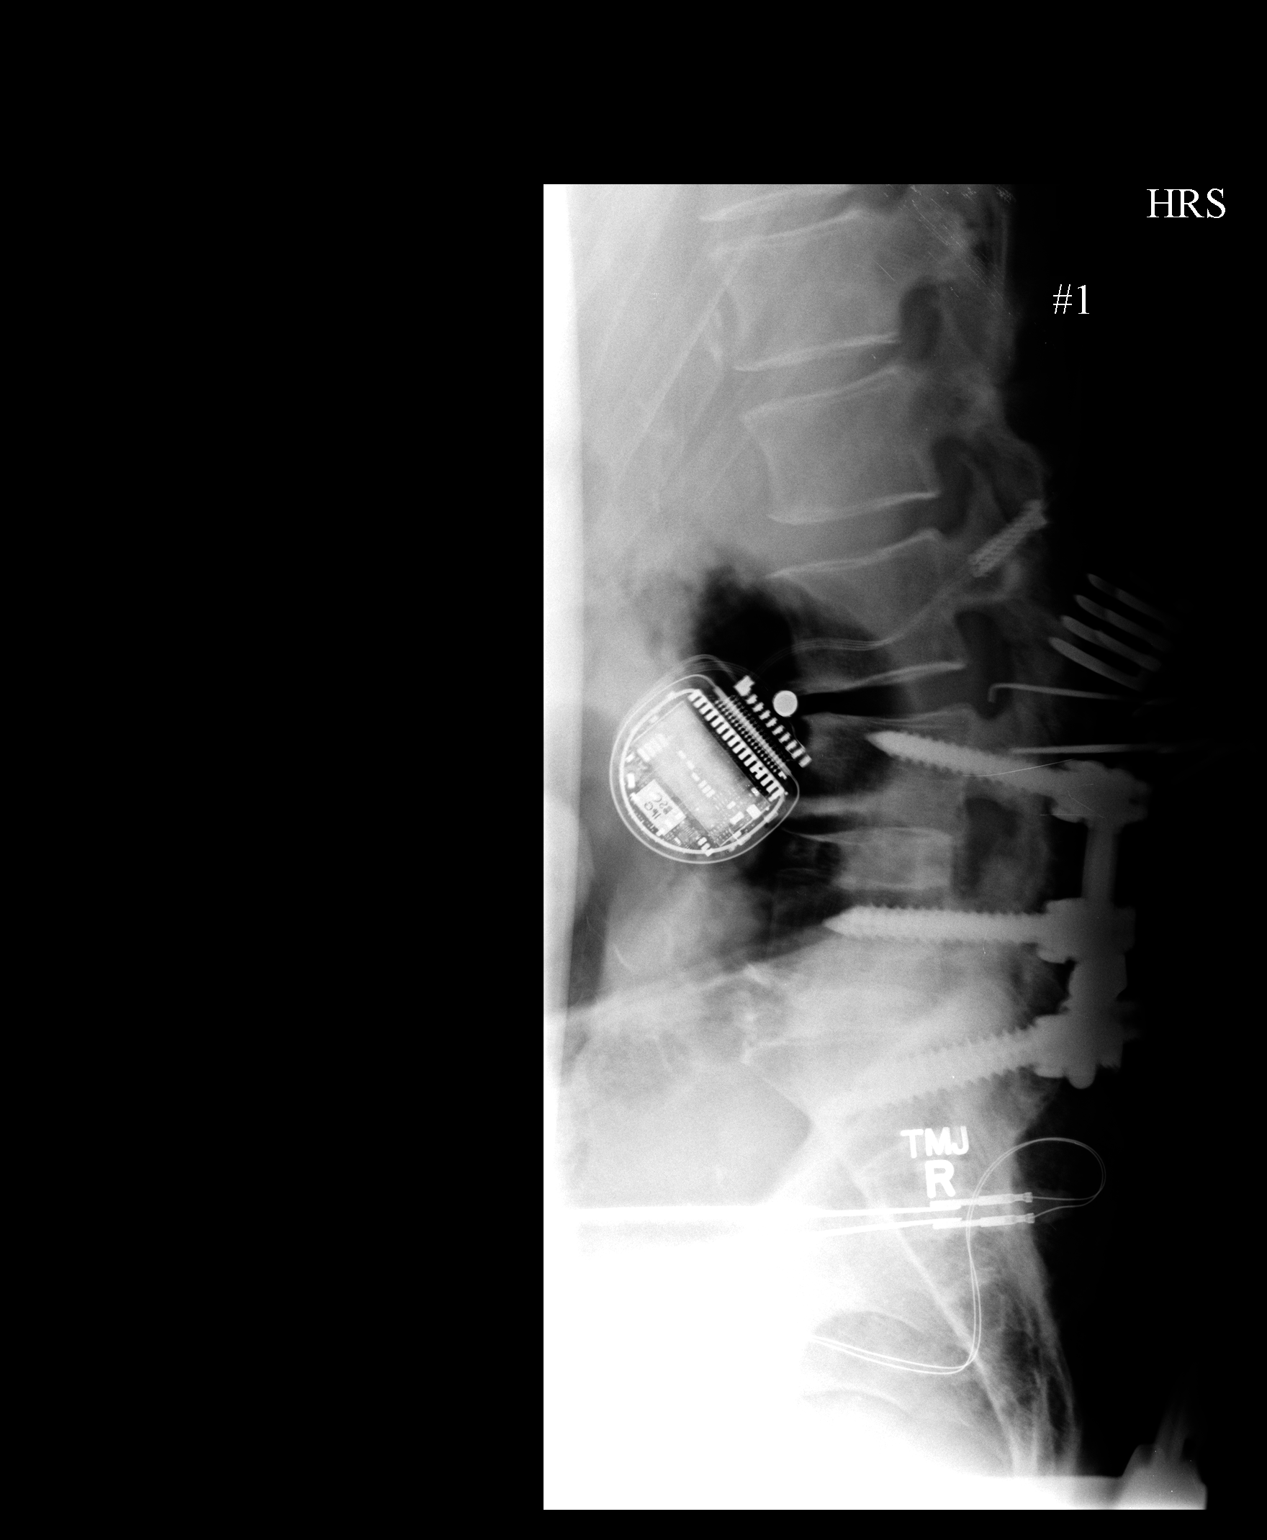

[1 of 1 positions shown; findings below may reference images not displayed]

FINDINGS: A single intraoperative cross-table lateral view of the
lumbar spine, taken at 9798 hours, is submitted.  Posterior lumbar
interbody fusion is seen at L4-S1.  There is reversal of the normal
lumbar lordosis centered at L3-4, possibly positional in nature.
Surgical instrument tips project posterior to the L3-4 disc space,
and over the L4 lamina.  Stimulator pack wires are noted.
IMPRESSION: Intraoperative visualization for L3-4 fusion.

## 2012-10-21 ENCOUNTER — Other Ambulatory Visit: Payer: Self-pay | Admitting: Cardiovascular Disease

## 2012-11-10 ENCOUNTER — Other Ambulatory Visit: Payer: Self-pay | Admitting: Cardiovascular Disease

## 2012-11-10 NOTE — Telephone Encounter (Signed)
NEED APPOINTMENT Fax Received. Refill Completed. Taiven Greenley Chowoe (R.M.A)   

## 2012-11-12 ENCOUNTER — Other Ambulatory Visit: Payer: Self-pay | Admitting: Orthopaedic Surgery

## 2012-11-12 DIAGNOSIS — M549 Dorsalgia, unspecified: Secondary | ICD-10-CM

## 2012-11-12 DIAGNOSIS — M542 Cervicalgia: Secondary | ICD-10-CM

## 2012-11-13 ENCOUNTER — Ambulatory Visit
Admission: RE | Admit: 2012-11-13 | Discharge: 2012-11-13 | Disposition: A | Discharge status: Home / Self Care | Payer: PRIVATE HEALTH INSURANCE | Source: Ambulatory Visit | Attending: Orthopaedic Surgery | Admitting: Orthopaedic Surgery

## 2012-11-13 VITALS — BP 70/46 | HR 78

## 2012-11-13 DIAGNOSIS — M549 Dorsalgia, unspecified: Secondary | ICD-10-CM

## 2012-11-13 DIAGNOSIS — M542 Cervicalgia: Secondary | ICD-10-CM

## 2012-11-13 MED ORDER — MEPERIDINE HCL 100 MG/ML IJ SOLN
75.0000 mg | Freq: Once | INTRAMUSCULAR | Status: AC
  Administered 2012-11-13: 75 mg via INTRAMUSCULAR

## 2012-11-13 MED ORDER — DIAZEPAM 5 MG PO TABS
5.0000 mg | ORAL_TABLET | Freq: Once | ORAL | Status: AC
  Administered 2012-11-13: 5 mg via ORAL

## 2012-11-13 MED ORDER — ONDANSETRON HCL 4 MG/2ML IJ SOLN
4.0000 mg | Freq: Once | INTRAMUSCULAR | Status: AC
Start: 2012-11-13 — End: 2012-11-13
  Administered 2012-11-13: 4 mg via INTRAMUSCULAR

## 2012-11-13 MED ORDER — OXYCODONE-ACETAMINOPHEN 5-325 MG PO TABS
2.0000 | ORAL_TABLET | Freq: Once | ORAL | Status: AC
  Administered 2012-11-13: 2 via ORAL

## 2012-11-13 MED ORDER — IOHEXOL 300 MG/ML  SOLN
10.0000 mL | Freq: Once | INTRAMUSCULAR | Status: AC | PRN
  Administered 2012-11-13: 10 mL via INTRATHECAL

## 2012-11-13 NOTE — Progress Notes (Signed)
Pt still in severe pain post pain meds, skin warm and dry, awake and alert. More pain meds given by mouth.

## 2012-11-13 NOTE — Progress Notes (Addendum)
Discharge instructions explained to pt. Pt took none of his meds this am because they needed to be taken with food. Pt instructed to take them when he gets home. CBG 296 this am.

## 2012-11-24 ENCOUNTER — Telehealth: Payer: Self-pay | Admitting: Cardiovascular Disease

## 2012-11-24 NOTE — Telephone Encounter (Signed)
Pt's wife called, because pt needs surgical clearance for back surgery scheduled for 12/14/12. Pt was seen on 01/09/12 with a dx of HTN. Pt is have a return visit in a year. Wife is aware that he may need to be seen prior clearance. Pt's wife states Dr Elease Hashimoto wants to see pt  in September this year.

## 2012-11-24 NOTE — Telephone Encounter (Signed)
New Prob    Pt is needing clearance for back surgery 8/11. Please call.

## 2012-11-26 NOTE — Telephone Encounter (Signed)
New Prob    Pt wife calling to provide a fax number for clearance: Dr. Sharolyn Douglas [phone] or [phone]. Approval to stop aspirin a week ahead is also needed per pts wife.

## 2012-11-26 NOTE — Telephone Encounter (Signed)
Called wife/ pt does not have CAD, he may hold his ASA, He is at low risk for his upcoming surgery per Dr Elease Hashimoto.  This note will be sent to Dr Noel Gerold.

## 2012-11-26 NOTE — Telephone Encounter (Signed)
lmtcb w/ Dr name to send clearance to.

## 2012-11-26 NOTE — Telephone Encounter (Signed)
Pt has HTN - no hx of CAD.  He is at low risk for his upcoming back surgery.

## 2012-11-30 ENCOUNTER — Telehealth: Payer: Self-pay | Admitting: Cardiovascular Disease

## 2012-11-30 NOTE — Telephone Encounter (Signed)
Patient does not have CAD, he may hold his ASA. He is at low risk for his upcoming surgery per Dr. Elease Hashimoto. This note will be sent to Dr. Noel Gerold. It was faxed to [phone]/djc

## 2013-03-02 ENCOUNTER — Other Ambulatory Visit: Payer: Self-pay | Admitting: Cardiovascular Disease

## 2013-03-09 ENCOUNTER — Other Ambulatory Visit: Payer: Self-pay | Admitting: Cardiovascular Disease

## 2013-03-12 ENCOUNTER — Other Ambulatory Visit: Payer: Self-pay | Admitting: Cardiovascular Disease

## 2013-03-24 ENCOUNTER — Encounter: Payer: Self-pay | Admitting: Cardiovascular Disease

## 2013-03-24 ENCOUNTER — Ambulatory Visit (INDEPENDENT_AMBULATORY_CARE_PROVIDER_SITE_OTHER): Payer: Medicare Other | Admitting: Cardiovascular Disease

## 2013-03-24 VITALS — BP 132/63 | HR 64 | Ht 67.5 in | Wt 187.0 lb

## 2013-03-24 DIAGNOSIS — I5032 Chronic diastolic (congestive) heart failure: Secondary | ICD-10-CM | POA: Insufficient documentation

## 2013-03-24 DIAGNOSIS — R600 Localized edema: Secondary | ICD-10-CM

## 2013-03-24 DIAGNOSIS — E785 Hyperlipidemia, unspecified: Secondary | ICD-10-CM

## 2013-03-24 DIAGNOSIS — I10 Essential (primary) hypertension: Secondary | ICD-10-CM

## 2013-03-24 DIAGNOSIS — I119 Hypertensive heart disease without heart failure: Secondary | ICD-10-CM

## 2013-03-24 DIAGNOSIS — R609 Edema, unspecified: Secondary | ICD-10-CM

## 2013-03-24 DIAGNOSIS — I428 Other cardiomyopathies: Secondary | ICD-10-CM

## 2013-03-24 DIAGNOSIS — I509 Heart failure, unspecified: Secondary | ICD-10-CM

## 2013-03-24 NOTE — Assessment & Plan Note (Signed)
Stable , fasting lipids, liver, BMP in 6 months

## 2013-03-24 NOTE — Progress Notes (Signed)
Christian [name] Date of Birth  1946/03/20       [facility] Office [address]  [address] [place], [place]  [address]   [place], [place]  [address] [phone]     [phone]   Fax  [phone]    Fax [phone]  Problem List: 1. Hypertension 2. Hypertensive cardiomyopathy 3. Back pain  History of Present Illness: 67 year old gentleman with a history of hypertension and hypertensive cardiopathy. He's having lots of problems with back pain and sees Dr. Vear Clock.  He's able to walk fairly short distances because of his back pain. He denies any cardiac problems with his exercise.  He has had back surgery since I last saw him.  He has not noticed any significant improvement in his functional capacity.  Nov. 19, 2014:  He has had another back surgery since I last saw him.   He had a 10 hr back procedure.   No CP or dyspnea.    Still walking with a walker.   His amlodipine was stopped while he was in the hospital because of hypotension.    Current Outpatient Prescriptions on File Prior to Visit  Medication Sig Dispense Refill  . aspirin 81 MG tablet Take 81 mg by mouth daily.        . cloNIDine (CATAPRES) 0.3 MG tablet TAKE ONE TABLET BY MOUTH TWICE DAILY  180 tablet  0  . ezetimibe-simvastatin (VYTORIN) 10-10 MG per tablet Take 1 tablet by mouth daily.        . fenofibrate (TRICOR) 145 MG tablet TAKE ONE TABLET BY MOUTH EVERY DAY  30 tablet  5  . insulin lispro (HUMALOG) 100 UNIT/ML injection Inject 20-25 Units into the skin 2 (two) times daily. 50 units in the morning and 30 units at night      . LACTULOSE PO Take by mouth. Taking 1-2 Tablespoon Daily       . levETIRAcetam (KEPPRA) 250 MG tablet Take 250 mg by mouth daily. 3 Tablets BID Daily      . oxyCODONE (OXYCONTIN) 15 MG TB12 Take 15 mg by mouth as needed.       . potassium chloride (KLOR-CON) 20 MEQ packet Take 10 mEq by mouth daily.       . TOPROL XL 200 MG 24  hr tablet TAKE ONE TABLET BY MOUTH EVERY DAY  30 tablet  0   No current facility-administered medications on file prior to visit.    Allergies  Allergen Reactions  . Penicillins Other (See Comments)    Causes weakness     Past Medical History  Diagnosis Date  . Diabetes mellitus   . Hypertension   . Hypertensive cardiomyopathy   . Peptic ulcer disease   . Back pain   . Diverticulitis     Past Surgical History  Procedure Laterality Date  . Back surgery      x3    History  Smoking status  . Former Smoker  . Quit date: 12/23/1968  Smokeless tobacco  . Not on file    History  Alcohol Use No    Family History  Problem Relation Age of Onset  . Heart failure Mother   . Cancer Father   . Diabetes Brother     Reviw of Systems:  Reviewed in the HPI.  All other systems are negative.  Physical Exam: Blood pressure 132/63, pulse 64, height 5' 7.5" (1.715 m), weight 187 lb (  84.823 kg). General: Well developed, well nourished, in no acute distress.  Head: Normocephalic, atraumatic, sclera non-icteric, mucus membranes are moist,   Neck: Supple. Carotids are 2 + without bruits. No JVD  Lungs: Clear bilaterally to auscultation.  Heart: regular rate.  normal  S1 S2. No murmurs, gallops or rubs.  Abdomen: Soft, non-tender, non-distended with normal bowel sounds. No hepatomegaly. No rebound/guarding. No masses.  Msk:  Strength and tone are normal  Extremities: No clubbing or cyanosis. No edema.  Distal pedal pulses are 2+ and equal bilaterally.  Neuro: Alert and oriented X 3. Moves all extremities spontaneously.  Psych:  Responds to questions appropriately with a normal affect.  ECG: Nov. 19, 2014:  NSR at 67. Normal ECG   Assessment / Plan:

## 2013-03-24 NOTE — Assessment & Plan Note (Signed)
Ellery has a hx of chronic diastolic CHF.  He has had 2 back surgeries over the past year which has slowed him down.  No recent echo.  Will get an echo in 6 months with an OV the week after.  Continue metoprolol and clonidine.  He was previously on amlodipine - i hesitate to restart it now given his 1+ leg edema.   Re assess in 6 months.

## 2013-03-24 NOTE — Patient Instructions (Addendum)
Your physician has requested that you have an echocardiogram IN 6 MONTHS . Echocardiography is a painless test that uses sound waves to create images of your heart. It provides your doctor with information about the size and shape of your heart and how well your heart's chambers and valves are working. This procedure takes approximately one hour. There are no restrictions for this procedure.   Your physician wants you to follow-up in: 6 MONTHS/ 1 WEEK AFTER ECHO HAS BEEN DONE  You will receive a reminder letter in the mail two months in advance. If you don't receive a letter, please call our office to schedule the follow-up appointment.  Your physician recommends that you return for a FASTING lipid profile: 6 MONTHS   Your physician recommends that you continue on your current medications as directed. Please refer to the Current Medication list given to you today.     I have recommended that you elevate your legs.  An ideal way is to get a [employer] Leg rest - invented by one of our local vascular surgeons.  Website:  [email or web address]

## 2013-03-30 ENCOUNTER — Other Ambulatory Visit: Payer: Self-pay | Admitting: Cardiovascular Disease

## 2013-05-18 ENCOUNTER — Other Ambulatory Visit: Payer: Self-pay

## 2013-05-18 MED ORDER — FENOFIBRATE 145 MG PO TABS: ORAL_TABLET | ORAL | Status: DC

## 2013-06-10 ENCOUNTER — Other Ambulatory Visit: Payer: Self-pay | Admitting: *Deleted

## 2013-06-10 MED ORDER — CLONIDINE HCL 0.3 MG PO TABS: ORAL_TABLET | ORAL | Status: DC

## 2013-09-08 ENCOUNTER — Other Ambulatory Visit: Payer: Self-pay | Admitting: Cardiovascular Disease

## 2013-09-28 ENCOUNTER — Other Ambulatory Visit (INDEPENDENT_AMBULATORY_CARE_PROVIDER_SITE_OTHER): Payer: Medicare Other

## 2013-09-28 ENCOUNTER — Ambulatory Visit (HOSPITAL_COMMUNITY): Payer: Medicare Other | Attending: Cardiology | Admitting: Radiology

## 2013-09-28 DIAGNOSIS — R609 Edema, unspecified: Secondary | ICD-10-CM

## 2013-09-28 DIAGNOSIS — R600 Localized edema: Secondary | ICD-10-CM

## 2013-09-28 DIAGNOSIS — I119 Hypertensive heart disease without heart failure: Secondary | ICD-10-CM

## 2013-09-28 DIAGNOSIS — E785 Hyperlipidemia, unspecified: Secondary | ICD-10-CM

## 2013-09-28 DIAGNOSIS — I509 Heart failure, unspecified: Secondary | ICD-10-CM

## 2013-09-28 DIAGNOSIS — I43 Cardiomyopathy in diseases classified elsewhere: Secondary | ICD-10-CM

## 2013-09-28 DIAGNOSIS — I5032 Chronic diastolic (congestive) heart failure: Secondary | ICD-10-CM

## 2013-09-28 DIAGNOSIS — I428 Other cardiomyopathies: Secondary | ICD-10-CM

## 2013-09-28 DIAGNOSIS — I10 Essential (primary) hypertension: Secondary | ICD-10-CM

## 2013-09-28 LAB — HEPATIC FUNCTION PANEL
ALT: 16 U/L (ref 0–53)
AST: 19 U/L (ref 0–37)
Albumin: 3.6 g/dL (ref 3.5–5.2)
Alkaline Phosphatase: 50 U/L (ref 39–117)
BILIRUBIN DIRECT: 0.1 mg/dL (ref 0.0–0.3)
BILIRUBIN TOTAL: 0.6 mg/dL (ref 0.2–1.2)
Total Protein: 7.2 g/dL (ref 6.0–8.3)

## 2013-09-28 LAB — BASIC METABOLIC PANEL
BUN: 34 mg/dL — AB (ref 6–23)
CALCIUM: 9.3 mg/dL (ref 8.4–10.5)
CO2: 39 meq/L — AB (ref 19–32)
Chloride: 89 mEq/L — ABNORMAL LOW (ref 96–112)
Creatinine, Ser: 1.4 mg/dL (ref 0.4–1.5)
GFR: 53.7 mL/min — AB (ref >60.00)
GLUCOSE: 129 mg/dL — AB (ref 70–99)
POTASSIUM: 2.7 meq/L — AB (ref 3.5–5.1)
SODIUM: 139 meq/L (ref 135–145)

## 2013-09-28 LAB — LIPID PANEL
CHOL/HDL RATIO: 5
Cholesterol: 82 mg/dL (ref 0–200)
HDL: 16.3 mg/dL — AB (ref >39.00)
LDL Cholesterol: 33 mg/dL (ref 0–99)
Triglycerides: 163 mg/dL — ABNORMAL HIGH (ref 0.0–149.0)
VLDL: 32.6 mg/dL (ref 0.0–40.0)

## 2013-09-28 NOTE — Progress Notes (Signed)
Echocardiogram performed.  

## 2013-10-04 ENCOUNTER — Encounter: Payer: Self-pay | Admitting: Cardiovascular Disease

## 2013-10-04 ENCOUNTER — Ambulatory Visit (INDEPENDENT_AMBULATORY_CARE_PROVIDER_SITE_OTHER): Payer: Medicare Other | Admitting: Cardiovascular Disease

## 2013-10-04 VITALS — BP 130/78 | HR 59 | Ht 67.5 in | Wt 190.0 lb

## 2013-10-04 DIAGNOSIS — E876 Hypokalemia: Secondary | ICD-10-CM

## 2013-10-04 DIAGNOSIS — I509 Heart failure, unspecified: Secondary | ICD-10-CM

## 2013-10-04 DIAGNOSIS — I5032 Chronic diastolic (congestive) heart failure: Secondary | ICD-10-CM

## 2013-10-04 LAB — BASIC METABOLIC PANEL
BUN: 22 mg/dL (ref 6–23)
CHLORIDE: 95 meq/L — AB (ref 96–112)
CO2: 34 meq/L — AB (ref 19–32)
Calcium: 9.1 mg/dL (ref 8.4–10.5)
Creatinine, Ser: 1.4 mg/dL (ref 0.4–1.5)
GFR: 52.39 mL/min — ABNORMAL LOW (ref >60.00)
GLUCOSE: 210 mg/dL — AB (ref 70–99)
POTASSIUM: 3.8 meq/L (ref 3.5–5.1)
SODIUM: 137 meq/L (ref 135–145)

## 2013-10-04 MED ORDER — SPIRONOLACTONE 25 MG PO TABS: 25.0000 mg | ORAL_TABLET | Freq: Every day | ORAL | Status: DC

## 2013-10-04 NOTE — Progress Notes (Signed)
Christian [name] V Vidales Date of Birth  11/21/1945       [facility]    [place] [facility] [address], [address]  [address], [address] [place], [place]  [address]   [place], [place]  [id number] [phone]     [phone]   Fax  [phone]    Fax [phone]  Problem List: 1. Hypertension 2. Hypertensive cardiomyopathy 3. Back pain  History of Present Illness: 68 year old gentleman with a history of hypertension and hypertensive cardiopathy. He's having lots of problems with back pain and sees Dr. Vear ClockPhillips.  He's able to walk fairly short distances because of his back pain. He denies any cardiac problems with his exercise.  He has had back surgery since I last saw him.  He has not noticed any significant improvement in his functional capacity.  Nov. 19, 2014:  He has had another back surgery since I last saw him.   He had a 10 hr back procedure.   No CP or dyspnea.    Still walking with a walker.   His amlodipine was stopped while he was in the hospital because of hypotension.   June 1,2015:    Has a history of mild diastolic dysfunction. He has normal left ventricular systolic function.  Christian [name] was found to have marked hypokalemia.   I found out today that his medical doctor started him on Lasix and metolazone  for leg swelling.   He apparently has been taking both of these medications for quite some time and his creatinine and potassium have been stable. For some reason his potassium became very low this past week.   Current Outpatient Prescriptions on File Prior to Visit  Medication Sig Dispense Refill  . aspirin 81 MG tablet Take 81 mg by mouth daily.        . cloNIDine (CATAPRES) 0.3 MG tablet TAKE 1 TABLET BY MOUTH TWICE DAILY  180 tablet  0  . DULoxetine (CYMBALTA) 60 MG capsule Take 60 mg by mouth daily.      Marland Kitchen. ezetimibe-simvastatin (VYTORIN) 10-10 MG per tablet Take 1 tablet by mouth daily.        . fenofibrate (TRICOR) 145 MG tablet TAKE ONE TABLET  BY MOUTH EVERY DAY  30 tablet  5  . furosemide (LASIX) 40 MG tablet Take 40 mg by mouth daily.      . insulin lispro (HUMALOG) 100 UNIT/ML injection Inject 20-25 Units into the skin 2 (two) times daily. 50 units in the morning and 30 units at night      . levETIRAcetam (KEPPRA) 250 MG tablet Take 250 mg by mouth daily. 3 Tablets BID Daily      . methocarbamol (ROBAXIN) 750 MG tablet Take 750 mg by mouth 3 (three) times daily.      . metoprolol (TOPROL-XL) 200 MG 24 hr tablet TAKE ONE TABLET BY MOUTH EVERY DAY  30 tablet  5  . omeprazole (PRILOSEC) 40 MG capsule Take 40 mg by mouth daily.      Marland Kitchen. oxyCODONE (OXYCONTIN) 15 MG TB12 Take 15 mg by mouth as needed.       . potassium chloride (KLOR-CON) 20 MEQ packet Take 10 mEq by mouth daily.       . pregabalin (LYRICA) 150 MG capsule Take 150 mg by mouth 2 (two) times daily.      . Ramelteon (ROZEREM PO) Take by mouth at bedtime.       No current facility-administered medications on file  prior to visit.    Allergies  Allergen Reactions  . Penicillins Other (See Comments)    Causes weakness     Past Medical History  Diagnosis Date  . Diabetes mellitus   . Hypertension   . Hypertensive cardiomyopathy   . Peptic ulcer disease   . Back pain   . Diverticulitis     Past Surgical History  Procedure Laterality Date  . Back surgery      x3    History  Smoking status  . Former Smoker  . Quit date: 12/23/1968  Smokeless tobacco  . Not on file    History  Alcohol Use No    Family History  Problem Relation Age of Onset  . Heart failure Mother   . Cancer Father   . Diabetes Brother     Reviw of Systems:  Reviewed in the HPI.  All other systems are negative.  Physical Exam: Blood pressure 130/78, pulse 59, height 5' 7.5" (1.715 m), weight 190 lb (86.183 kg). General: Well developed, well nourished, in no acute distress.  Head: Normocephalic, atraumatic, sclera non-icteric, mucus membranes are moist,   Neck: Supple.  Carotids are 2 + without bruits. No JVD  Lungs: Clear bilaterally to auscultation.  Heart: regular rate.  normal  S1 S2. No murmurs, gallops or rubs.  Abdomen: Soft, non-tender, non-distended with normal bowel sounds. No hepatomegaly. No rebound/guarding. No masses.  Msk:  Strength and tone are normal  Extremities: No clubbing or cyanosis. No edema.  Distal pedal pulses are 2+ and equal bilaterally.  Neuro: Alert and oriented X 3. Moves all extremities spontaneously.  Psych:  Responds to questions appropriately with a normal affect.  ECG: Nov. 19, 2014:  NSR at 5. Normal ECG   Assessment / Plan:

## 2013-10-04 NOTE — Patient Instructions (Addendum)
Your physician has recommended you make the following change in your medication:  STOP Metolazone  START Spironolactone 25 mg once daily CONTINUE potassium supplement and lasix  Your physician recommends that you have lab work TODAY and again Thursday or Friday with Dr. Waynetta Sandy  Your physician wants you to follow-up in: 6 months with Dr. Elease Hashimoto.  You will receive a reminder letter in the mail two months in advance. If you don't receive a letter, please call our office to schedule the follow-up appointment.

## 2013-10-04 NOTE — Assessment & Plan Note (Addendum)
Dell has mild diastolic dysfunction but has had significant abdominal distention and leg edema. The degree of edema seems to be way out of proportion to his degree of mild diastolic congestive heart failure. In addition, his kidney function has been fairly well preserved.  He was started on metolazone and Lasix by his medical Dr. We performed some lab work and found that he was markedly hypokalemic and had him return today for further evaluation. It was at this visit that we realize he had been started on metolazone and Lasix together for this leg edema.  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   Over aggressive diuresis in the setting can also lead to hyperkalemia without actually improving the edema.  I once again stressed the importance of leg elevation and compression hose.  His overall condition seems to be gradually deteriorating. He is extremely weak-this seems to be from his back surgery last year. At this point I am curious as to why his potassium level is so markedly decreased and I do not think that it is due to his diastolic dysfunction.  At this point we will discontinue the metolazone. I would like to start him on spironolactone 25 mg a day. I would like for his medical doctors to assume care for this hypokalemia.   We'll continue with potassium chloride 10 mEq a day. He'll need to recheck his potassium in 2-3 days.  I will see him  again in 6 months for followup visit.   Will call his medical doctor to discuss [phone])

## 2013-10-05 ENCOUNTER — Telehealth: Payer: Self-pay | Admitting: Cardiovascular Disease

## 2013-10-05 NOTE — Telephone Encounter (Signed)
Talked with Gaye Alken, PA with [facility]. They will follow up with his leg edema. It does not appear to be due to a cardiac etiology. They will also follow up with his hypokalemia  Vesta Mixer, Montez Hageman., MD, [facility] 10/05/2013, 1:33 PM [address],  [address] Office [phone] Pager [phone]

## 2013-10-12 ENCOUNTER — Other Ambulatory Visit: Payer: Self-pay | Admitting: *Deleted

## 2013-10-12 MED ORDER — METOPROLOL SUCCINATE ER 200 MG PO TB24: ORAL_TABLET | ORAL | Status: DC

## 2013-11-19 ENCOUNTER — Other Ambulatory Visit: Payer: Self-pay | Admitting: Cardiovascular Disease

## 2013-12-06 ENCOUNTER — Other Ambulatory Visit: Payer: Self-pay | Admitting: Cardiovascular Disease

## 2014-03-07 ENCOUNTER — Other Ambulatory Visit: Payer: Self-pay | Admitting: Cardiovascular Disease

## 2014-03-25 ENCOUNTER — Other Ambulatory Visit: Payer: Self-pay | Admitting: Cardiovascular Disease

## 2014-04-05 ENCOUNTER — Ambulatory Visit: Payer: Medicare Other | Admitting: Cardiovascular Disease

## 2014-04-08 ENCOUNTER — Other Ambulatory Visit: Payer: Self-pay | Admitting: Cardiovascular Disease

## 2014-04-09 NOTE — Telephone Encounter (Signed)
Rx was sent to pharmacy electronically. 

## 2014-04-26 IMAGING — RF DG MYELOGRAM 2+ REGIONS
12 of 24 series · 12 of 24 positions shown · non-contrast
Comparison: Multiple priors.
COMPARISON: None.

MYELOGRAM  INJECTION
TECHNIQUE: Informed consent was obtained from the patient prior to
the procedure, including potential complications of headache,
allergy, infection and pain. Specific instructions were given
regarding 24 hour bedrest post procedure to prevent post-LP
headache.  A timeout procedure was performed.  With the patient
prone, the lower back was prepped with Betadine.  1% Lidocaine was
used for local anesthesia.  Lumbar puncture was performed by the
radiologist at the L2-3 level using a 22 gauge needle with return
of clear CSF.  I personally performed the lumbar puncture and
administered the intrathecal contrast. I also personally supervised
acquisition of the myelogram images. 10 ml of Bmnipaque-MZZ was
injected into the subarachnoid space .
CLINICAL DATA: Low back pain.  Mid back pain.  Neck pain.
TECHNIQUE: Multidetector CT imaging of the lumbar spine was
performed following myelography.  Multiplanar CT image
reconstructions were also generated.
TECHNIQUE: Multidetector CT imaging of the cervical spine was
TECHNIQUE: CT imaging of the thoracic spine was performed after
intrathecal contrast administration.  Multiplanar CT image

[Series 3: (hospital) · 1 of 1 slices shown]
[im 1/1]
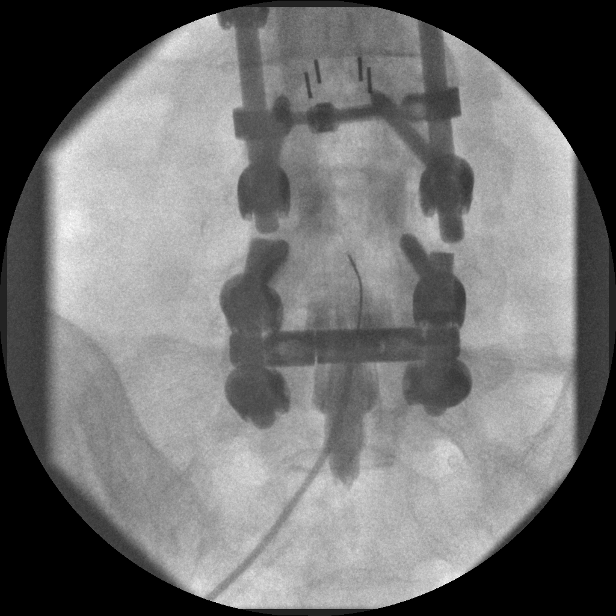

[Series 6: myelogram  white · 1 of 1 slices shown (1 of 11)]
[im 1/1]
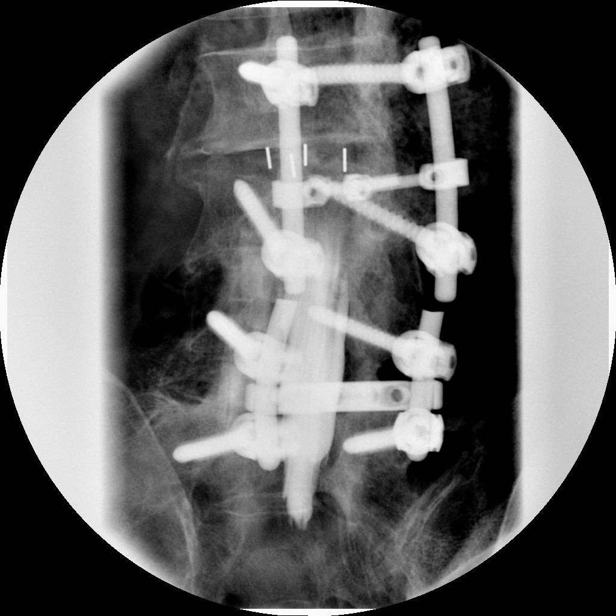

[Series 8: myelogram  white · 1 of 1 slices shown (2 of 11)]
[im 1/1]
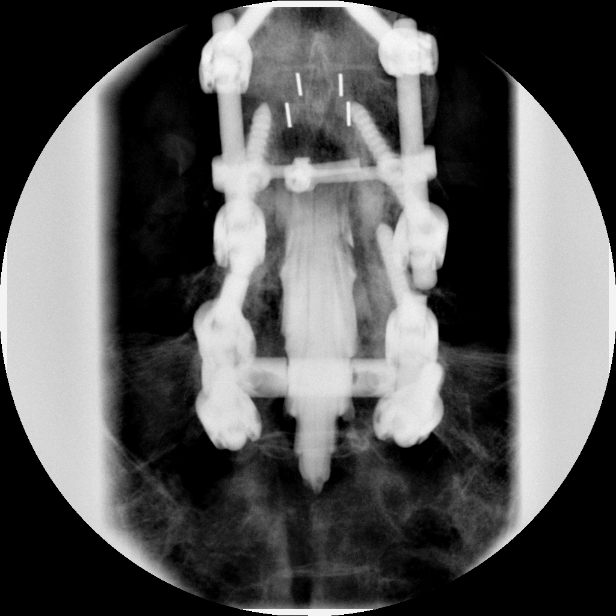

[Series 10: myelogram  white · 1 of 1 slices shown (3 of 11)]
[im 1/1]
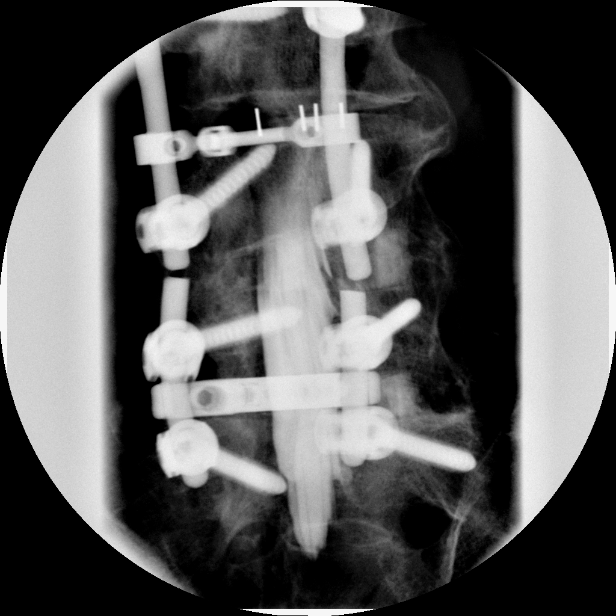

[Series 12: myelogram  white · 1 of 1 slices shown (4 of 11)]
[im 1/1]
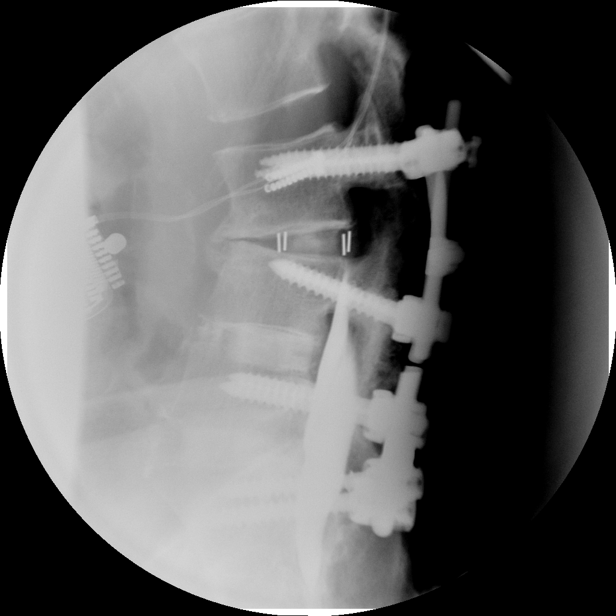

[Series 14: myelogram  white · 1 of 1 slices shown (5 of 11)]
[im 1/1]
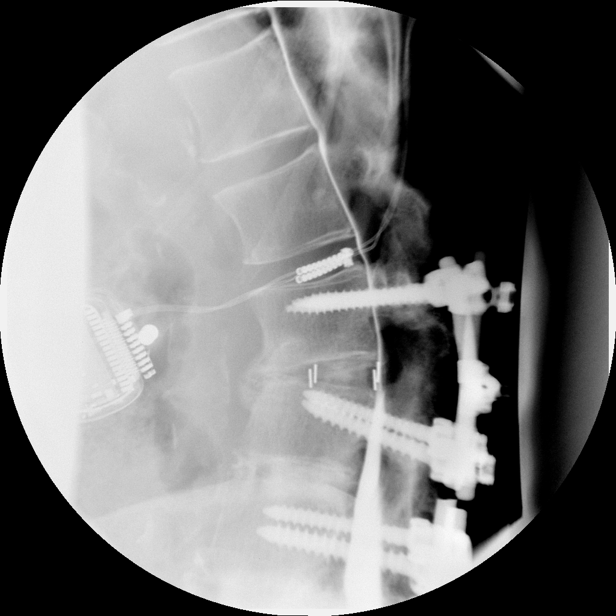

[Series 16: myelogram  white · 1 of 1 slices shown (6 of 11)]
[im 1/1]
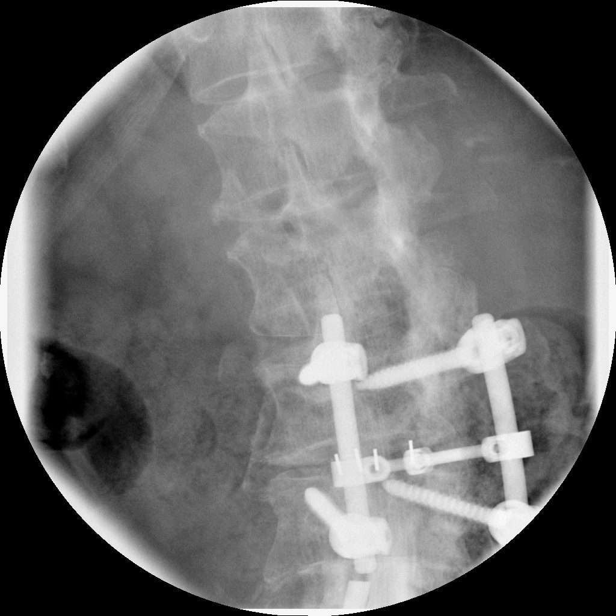

[Series 18: myelogram  white · 1 of 1 slices shown (7 of 11)]
[im 1/1]
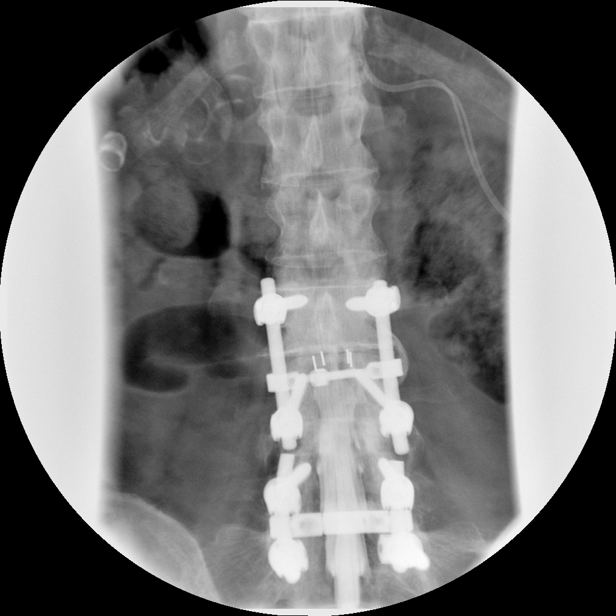

[Series 20: myelogram  white · 1 of 1 slices shown (8 of 11)]
[im 1/1]
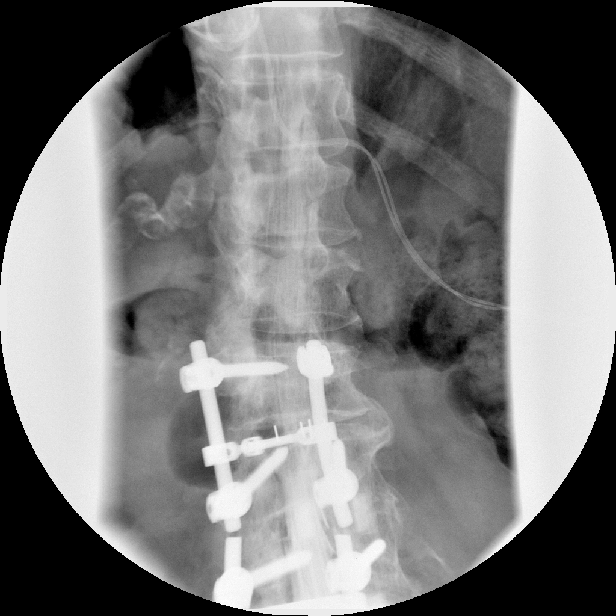

[Series 22: myelogram  white · 1 of 1 slices shown (9 of 11)]
[im 1/1]
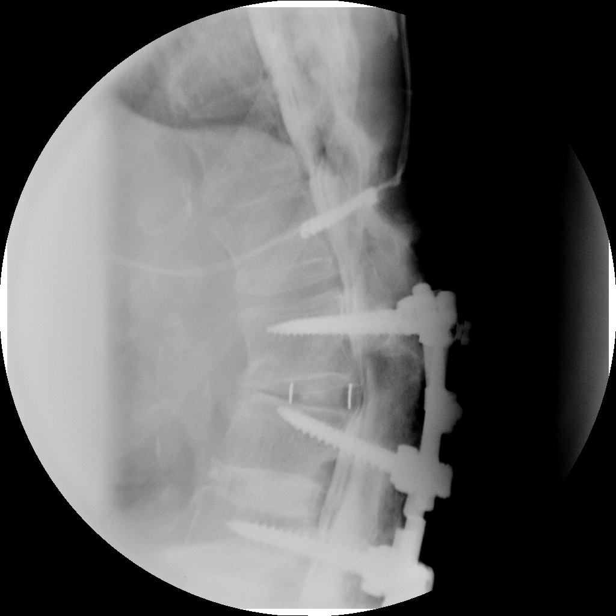

[Series 24: myelogram  white · 1 of 1 slices shown (10 of 11)]
[im 1/1]
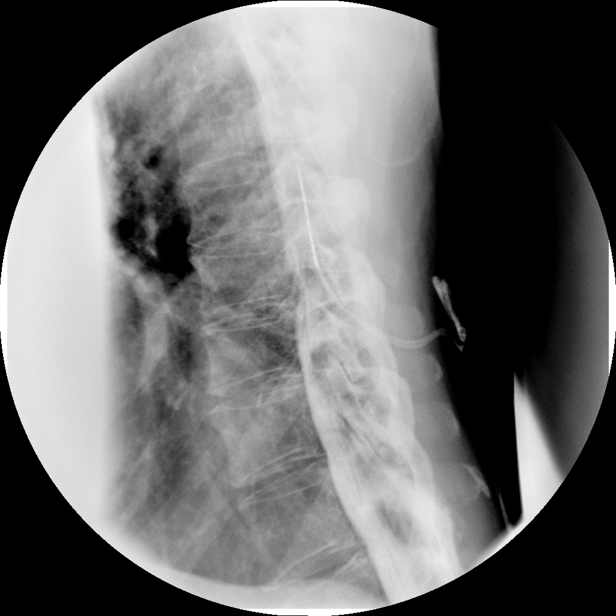

[Series 26: myelogram  white · 1 of 1 slices shown (11 of 11)]
[im 1/1]
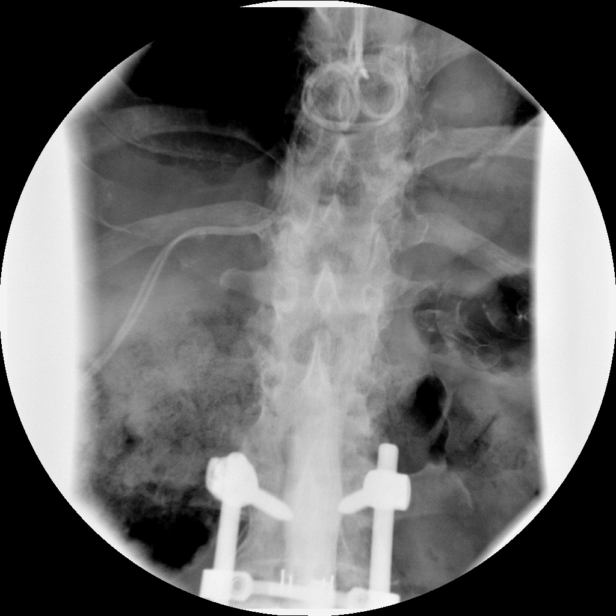

[12 of 24 positions shown; findings below may reference images not displayed]

IMPRESSION: Successful injection of  intrathecal contrast for myelography.

MYELOGRAM LUMBAR
FINDINGS: Good opacification lumbar subarachnoid space.  Hardware
intact status post L3-S1 fusion.  Moderate kyphotic angulation due
to narrowing of the ventral interspace at L3-4.  Solid appearing
intervertebral fusion at L4-5 and L5-S1.  Solid interbody bridging
anteriorly L3-L4.  Hardware intact.  No adjacent segment neural
compression.  No abnormal movement with flexion and extension.

Fluoroscopy Time: 4.02 minutes
IMPRESSION: As above.

CT MYELOGRAPHY LUMBAR SPINE
FINDINGS: There is loss of the normal lumbar lordosis,  in fact
there is reversal of the normal lordotic curve with a approximate
30 degrees kyphosis.  The conus appears normal.

L1-2: Mild bulge.  Mild facet arthropathy.  No impingement.

L2-3: Vacuum phenomenon.  Advanced facet arthropathy.  No
impingement.

L3-4: Solid fusion.  No residual stenosis or impingement.

L4-5: Solid fusion.  No residual stenosis or impingement.

L5-S1: Solid fusion.  No residual stenosis or impingement.
IMPRESSION: Reversal of the normal lumbar lordosis with approximately 30
degrees kyphosis.

Solid fusion L3-S1.

Facet arthropathy L2-L3 without impingement.

MYELOGRAM CERVICAL
FINDINGS: Fair opacification cervical subarachnoid space.  No nerve
root cut off or spinal stenosis.  Anatomic alignment.
IMPRESSION: As above.

CT MYELOGRAPHY CERVICAL SPINE
The alignment is anatomic. There is mild straightening of the
cervical spine with loss of the normal cervical lordosis.  There is
no prevertebral soft tissue swelling.  Lung apices are clear.

The individual disc spaces were examined as follows:

C2-3:  Normal disc space.  Mild facet arthropathy.

C3-4:  The disc space is normal but there is mild bilateral
uncinate spurring and right greater than left facet arthropathy.
This could affect both C4 nerve roots.

C4-5:  Mild bulge.  Bilateral uncinate spurring and right greater
than left facet arthropathy.  Right greater than left C5 nerve root
impingement is possible.

C5-6:  Mild bulge.  Slight bilateral uncinate spurring without
clear-cut C6 nerve root impingement.

C6-7:  Anterior osteophytic spurring.  Bilateral uncinate
spurring..  Left greater than right C7 nerve root impingement is
possible.

C7-T1:  Bilateral facet arthropathy.  No impingement.
IMPRESSION: Multilevel spondylosis as described.

MYELOGRAM THORACIC
FINDINGS: Fair opacification thoracic subarachnoid space.  Mid
thoracic dorsal column stimulator appears uncomplicated.  No spinal
stenosis or cord compression.
IMPRESSION: As above.

CT MYELOGRAPHY THORACIC SPINE
FINDINGS: There is mild straightening of the thoracic spine with
loss of the normal thoracic kyphosis.

The thoracic intervertebral disc spaces are normal accept as listed
below:

T5-T6:  Shallow central protrusion is non compressive.

Partially calcified rightward protrusion T9-T10, non compressive.

Unremarkable appearing dorsal column stimulator lies in the midline
of the epidural space between the T8 and T9 vertebral bodies.
Within limits evaluation on CT, the leads appear intact.

Lower thoracic facet arthropathy is prominent at T10-11, T10-11 and
T12-L1.
IMPRESSION: Thoracic spondylosis as described.

## 2014-05-12 ENCOUNTER — Ambulatory Visit (INDEPENDENT_AMBULATORY_CARE_PROVIDER_SITE_OTHER): Payer: Medicare Other | Admitting: Cardiovascular Disease

## 2014-05-12 ENCOUNTER — Encounter: Payer: Self-pay | Admitting: Cardiovascular Disease

## 2014-05-12 VITALS — BP 115/68 | HR 63 | Ht 67.5 in | Wt 192.2 lb

## 2014-05-12 DIAGNOSIS — I5032 Chronic diastolic (congestive) heart failure: Secondary | ICD-10-CM

## 2014-05-12 DIAGNOSIS — I10 Essential (primary) hypertension: Secondary | ICD-10-CM

## 2014-05-12 NOTE — Assessment & Plan Note (Signed)
Christian [name] is doing well. Continue same meds

## 2014-05-12 NOTE — Progress Notes (Signed)
Christian [name] Date of Birth  03-Nov-1945       [facility] Office [address], [address]  [address] [place], [place]  [address]   [place], [place]  [address] [phone]     [phone]   Fax  [phone]    Fax [phone]  Problem List: 1. Hypertension 2. Hypertensive cardiomyopathy 3. Back pain  History of Present Illness: 69 year old gentleman with a history of hypertension and hypertensive cardiopathy. He's having lots of problems with back pain and sees Dr. Vear Clock.  He's able to walk fairly short distances because of his back pain. He denies any cardiac problems with his exercise.  He has had back surgery since I last saw him.  He has not noticed any significant improvement in his functional capacity.  Nov. 19, 2014:  He has had another back surgery since I last saw him.   He had a 10 hr back procedure.   No CP or dyspnea.    Still walking with a walker.   His amlodipine was stopped while he was in the hospital because of hypotension.   June 1,2015:    Has a history of mild diastolic dysfunction. He has normal left ventricular systolic function.  Christian [name] was found to have marked hypokalemia.   I found out today that his medical doctor started him on Lasix and metolazone  for leg swelling.   He apparently has been taking both of these medications for quite some time and his creatinine and potassium have been stable. For some reason his potassium became very low this past week.  Jan. 7, 2016:    Christian [name] is a 69 yo who we follow for HTN He is more active now.   Breathing is ok with exercise.    Current Outpatient Prescriptions on File Prior to Visit  Medication Sig Dispense Refill  . aspirin 81 MG tablet Take 81 mg by mouth daily.      . cloNIDine (CATAPRES) 0.3 MG tablet TAKE 1 TABLET BY MOUTH TWICE DAILY 180 tablet 1  . ezetimibe-simvastatin (VYTORIN) 10-10 MG per tablet Take 1 tablet by mouth daily.      .  fenofibrate (TRICOR) 145 MG tablet TAKE 1 TABLET BY MOUTH ONCE DAILY 30 tablet 5  . furosemide (LASIX) 40 MG tablet Take 40 mg by mouth daily.    Marland Kitchen levETIRAcetam (KEPPRA) 250 MG tablet Take 250 mg by mouth daily. 3 Tablets BID Daily    . methocarbamol (ROBAXIN) 750 MG tablet Take 750 mg by mouth 3 (three) times daily.    . metoprolol (TOPROL-XL) 200 MG 24 hr tablet TAKE 1 TABLET BY MOUTH ONCE DAILY 30 tablet 5  . omeprazole (PRILOSEC) 40 MG capsule Take 40 mg by mouth daily.    Marland Kitchen oxyCODONE (OXYCONTIN) 15 MG TB12 Take 15 mg by mouth as needed.     . potassium chloride (K-DUR,KLOR-CON) 10 MEQ tablet Take 10 mEq by mouth 2 (two) times daily.    . Ramelteon (ROZEREM PO) Take by mouth at bedtime.    Marland Kitchen spironolactone (ALDACTONE) 25 MG tablet Take 1 tablet (25 mg total) by mouth daily. 90 tablet 3  . insulin lispro (HUMALOG) 100 UNIT/ML injection Inject 20-25 Units into the skin 2 (two) times daily. 50 units in the morning and 30 units at night    . potassium chloride (KLOR-CON) 20 MEQ packet Take 10 mEq by mouth daily.      No  current facility-administered medications on file prior to visit.    Allergies  Allergen Reactions  . Penicillins Other (See Comments)    Other reaction(s): Unknown Other reaction(s): Dizziness Causes weakness     Past Medical History  Diagnosis Date  . Diabetes mellitus   . Hypertension   . Hypertensive cardiomyopathy   . Peptic ulcer disease   . Back pain   . Diverticulitis     Past Surgical History  Procedure Laterality Date  . Back surgery      x3    History  Smoking status  . Former Smoker  . Quit date: 12/23/1968  Smokeless tobacco  . Not on file    History  Alcohol Use No    Family History  Problem Relation Age of Onset  . Heart failure Mother   . Cancer Father   . Diabetes Brother     Reviw of Systems:  Reviewed in the HPI.  All other systems are negative.  Physical Exam: Blood pressure 115/68, pulse 63, height 5' 7.5" (1.715  m), weight 192 lb 3.2 oz (87.181 kg). General: Well developed, well nourished, in no acute distress.  Head: Normocephalic, atraumatic, sclera non-icteric, mucus membranes are moist,   Neck: Supple. Carotids are 2 + without bruits. No JVD  Lungs: Clear bilaterally to auscultation.  Heart: regular rate.  normal  S1 S2. Occasional premature beat.   Abdomen: Soft, non-tender,  Mild - moderate distension.   Msk:  Strength and tone are normal  Extremities: No clubbing or cyanosis. No edema.  Distal pedal pulses are 2+ and equal bilaterally.  Neuro: Alert and oriented X 3. Moves all extremities spontaneously.  Psych:  Responds to questions appropriately with a normal affect.  ECG: Jan. 7, 2016,  NSR at 63.  Rare PVC.  Assessment / Plan:

## 2014-05-12 NOTE — Patient Instructions (Signed)
Your physician recommends that you continue on your current medications as directed. Please refer to the Current Medication list given to you today.  Your physician wants you to follow-up in: 6 months with Dr. Nahser.  You will receive a reminder letter in the mail two months in advance. If you don't receive a letter, please call our office to schedule the follow-up appointment.  

## 2014-08-26 ENCOUNTER — Other Ambulatory Visit: Payer: Self-pay | Admitting: Cardiovascular Disease

## 2014-09-06 ENCOUNTER — Other Ambulatory Visit: Payer: Self-pay | Admitting: Cardiovascular Disease

## 2014-09-20 ENCOUNTER — Other Ambulatory Visit: Payer: Self-pay | Admitting: Cardiovascular Disease

## 2014-09-27 ENCOUNTER — Other Ambulatory Visit: Payer: Self-pay | Admitting: Cardiovascular Disease

## 2015-01-04 ENCOUNTER — Other Ambulatory Visit: Payer: Self-pay | Admitting: Cardiovascular Disease

## 2015-01-10 ENCOUNTER — Ambulatory Visit (INDEPENDENT_AMBULATORY_CARE_PROVIDER_SITE_OTHER): Payer: Medicare Other | Admitting: Cardiovascular Disease

## 2015-01-10 ENCOUNTER — Encounter: Payer: Self-pay | Admitting: Cardiovascular Disease

## 2015-01-10 VITALS — BP 100/60 | HR 74 | Ht 67.5 in | Wt 198.2 lb

## 2015-01-10 DIAGNOSIS — I5032 Chronic diastolic (congestive) heart failure: Secondary | ICD-10-CM | POA: Diagnosis not present

## 2015-01-10 DIAGNOSIS — E785 Hyperlipidemia, unspecified: Secondary | ICD-10-CM | POA: Diagnosis not present

## 2015-01-10 DIAGNOSIS — I10 Essential (primary) hypertension: Secondary | ICD-10-CM

## 2015-01-10 MED ORDER — CLONIDINE HCL 0.3 MG PO TABS: 0.3000 mg | ORAL_TABLET | Freq: Two times a day (BID) | ORAL | Status: AC

## 2015-01-10 MED ORDER — FENOFIBRATE 145 MG PO TABS: 145.0000 mg | ORAL_TABLET | Freq: Every day | ORAL | Status: AC

## 2015-01-10 MED ORDER — SPIRONOLACTONE 25 MG PO TABS: 25.0000 mg | ORAL_TABLET | Freq: Every day | ORAL | Status: AC

## 2015-01-10 MED ORDER — METOPROLOL SUCCINATE ER 200 MG PO TB24: 200.0000 mg | ORAL_TABLET | Freq: Every day | ORAL | Status: AC

## 2015-01-10 NOTE — Patient Instructions (Signed)
Medication Instructions:  Your physician recommends that you continue on your current medications as directed. Please refer to the Current Medication list given to you today.   Labwork: None Ordered   Testing/Procedures: None Ordered   Follow-Up: Your physician recommends that you schedule a follow-up appointment in: as needed with Dr. Nahser     

## 2015-01-10 NOTE — Progress Notes (Signed)
Christian [name] Date of Birth  October 27, 1945       [facility] Office [address]  [address], [address] [place], [place]  [address]   [place], [place]  [address] [phone]     [phone]   Fax  [phone]    Fax [phone]  Problem List: 1. Hypertension 2. Hypertensive cardiomyopathy 3. Back pain  History of Present Illness: 69 year old gentleman with a history of hypertension and hypertensive cardiopathy. He's having lots of problems with back pain and sees Dr. Vear Clock.  He's able to walk fairly short distances because of his back pain. He denies any cardiac problems with his exercise.  He has had back surgery since I last saw him.  He has not noticed any significant improvement in his functional capacity.  Nov. 19, 2014:  He has had another back surgery since I last saw him.   He had a 10 hr back procedure.   No CP or dyspnea.    Still walking with a walker.   His amlodipine was stopped while he was in the hospital because of hypotension.   June 1,2015:    Has a history of mild diastolic dysfunction. He has normal left ventricular systolic function.  Elijah Birk was found to have marked hypokalemia.   I found out today that his medical doctor started him on Lasix and metolazone  for leg swelling.   He apparently has been taking both of these medications for quite some time and his creatinine and potassium have been stable. For some reason his potassium became very low this past week.  Jan. 7, 2016:    Elijah Birk is a 69 yo who we follow for HTN He is more active now.   Breathing is ok with exercise.   Sept. 6, 2-016:  Doing well. Is on more diabetic meds - glucose is better      Current Outpatient Prescriptions on File Prior to Visit  Medication Sig Dispense Refill  . aspirin 81 MG tablet Take 81 mg by mouth daily.      . cloNIDine (CATAPRES) 0.3 MG tablet TAKE 1 TABLET BY MOUTH TWICE DAILY 180 tablet 1  . Coenzyme Q10 (CO  Q 10) 100 MG CAPS Take 1 tablet by mouth daily.    Marland Kitchen ezetimibe-simvastatin (VYTORIN) 10-10 MG per tablet Take 1 tablet by mouth daily.      . fenofibrate (TRICOR) 145 MG tablet TAKE 1 TABLET BY MOUTH ONCE DAILY 30 tablet 5  . furosemide (LASIX) 40 MG tablet Take 40 mg by mouth daily.    Marland Kitchen gabapentin (NEURONTIN) 600 MG tablet Take 600 mg by mouth 3 (three) times daily.    . insulin lispro protamine-lispro (HUMALOG 75/25 MIX) (75-25) 100 UNIT/ML SUSP injection Inject 60 Units into the skin 2 (two) times daily with a meal.    . levETIRAcetam (KEPPRA) 250 MG tablet Take 250 mg by mouth daily. 3 Tablets BID Daily    . metoprolol (TOPROL-XL) 200 MG 24 hr tablet TAKE 1 TABLET BY MOUTH ONCE DAILY 30 tablet 5  . omeprazole (PRILOSEC) 40 MG capsule Take 40 mg by mouth daily.    Marland Kitchen oxyCODONE (OXYCONTIN) 15 MG TB12 Take 15 mg by mouth as needed (AS NEEDED FOR PAIN).     Marland Kitchen potassium chloride (K-DUR,KLOR-CON) 10 MEQ tablet Take 10 mEq by mouth 2 (two) times daily.    . potassium chloride (KLOR-CON) 20 MEQ packet Take 10 mEq by  mouth daily.     Marland Kitchen spironolactone (ALDACTONE) 25 MG tablet TAKE ONE TABLET BY MOUTH DAILY 90 tablet 1  . vitamin C (ASCORBIC ACID) 500 MG tablet Take 500 mg by mouth daily.    . zaleplon (SONATA) 10 MG capsule Take 10 mg by mouth at bedtime as needed for sleep.     No current facility-administered medications on file prior to visit.    Allergies  Allergen Reactions  . Penicillins Other (See Comments)    Other reaction(s): Unknown Other reaction(s): Dizziness Causes weakness     Past Medical History  Diagnosis Date  . Diabetes mellitus   . Hypertension   . Hypertensive cardiomyopathy   . Peptic ulcer disease   . Back pain   . Diverticulitis     Past Surgical History  Procedure Laterality Date  . Back surgery      x3    History  Smoking status  . Former Smoker  . Quit date: 12/23/1968  Smokeless tobacco  . Not on file    History  Alcohol Use No    Family  History  Problem Relation Age of Onset  . Heart failure Mother   . Cancer Father   . Diabetes Brother     Reviw of Systems:  Reviewed in the HPI.  All other systems are negative.  Physical Exam: Blood pressure 100/60, pulse 74, height 5' 7.5" (1.715 m), weight 89.903 kg (198 lb 3.2 oz), SpO2 91 %. General: Well developed, well nourished, in no acute distress.  Head: Normocephalic, atraumatic, sclera non-icteric, mucus membranes are moist,   Neck: Supple. Carotids are 2 + without bruits. No JVD  Lungs: Clear bilaterally to auscultation.  Heart: regular rate.  normal  S1 S2. Occasional premature beat.   Abdomen: Soft, non-tender,  Mild - moderate distension.   Msk:  Strength and tone are normal  Extremities: No clubbing or cyanosis. No edema.  Distal pedal pulses are 2+ and equal bilaterally.  Neuro: Alert and oriented X 3. Moves all extremities spontaneously.  Psych:  Responds to questions appropriately with a normal affect.  ECG: Jan. 7, 2016,  NSR at 61.  Rare PVC.  Assessment / Plan:    1. Hypertension-  blood pressure is well-controlled. He's on several medications and seems to be doing quite well. His blood pressure is a little on the low side today and it's possible that he does not need to take as high dose of clonidine as he is currently taking. He would like to follow-up with his primary medical doctor in [place] and was to see me as needed.  We will continue same medications.   2. Hypertensive cardiomyopathy- he has mild diastolic dysfunction. His symptoms are very well-controlled on the current dose of Lasix, spironolactone, metoprolol  3. Back pain   Charnice Zwilling, Deloris Ping, MD  01/10/2015 11:40 AM    [facility] [address] [place],  [address] [place], [place]  [address] Pager [phone] Phone: [phone]; Fax: [phone]   [facility]  [address] [place], [place]  [address] [phone]   Fax [phone]

## 2019-05-20 DIAGNOSIS — E1165 Type 2 diabetes mellitus with hyperglycemia: Secondary | ICD-10-CM | POA: Diagnosis not present

## 2019-05-20 DIAGNOSIS — E162 Hypoglycemia, unspecified: Secondary | ICD-10-CM | POA: Diagnosis not present

## 2019-05-20 DIAGNOSIS — E1149 Type 2 diabetes mellitus with other diabetic neurological complication: Secondary | ICD-10-CM | POA: Diagnosis not present

## 2019-05-20 DIAGNOSIS — G894 Chronic pain syndrome: Secondary | ICD-10-CM | POA: Diagnosis not present

## 2019-05-20 DIAGNOSIS — I10 Essential (primary) hypertension: Secondary | ICD-10-CM | POA: Diagnosis not present

## 2019-05-20 DIAGNOSIS — G629 Polyneuropathy, unspecified: Secondary | ICD-10-CM | POA: Diagnosis not present

## 2019-05-20 DIAGNOSIS — Z6826 Body mass index (BMI) 26.0-26.9, adult: Secondary | ICD-10-CM | POA: Diagnosis not present

## 2019-06-15 DIAGNOSIS — E1149 Type 2 diabetes mellitus with other diabetic neurological complication: Secondary | ICD-10-CM | POA: Diagnosis not present

## 2019-06-15 DIAGNOSIS — G894 Chronic pain syndrome: Secondary | ICD-10-CM | POA: Diagnosis not present

## 2019-06-15 DIAGNOSIS — E1165 Type 2 diabetes mellitus with hyperglycemia: Secondary | ICD-10-CM | POA: Diagnosis not present

## 2019-06-15 DIAGNOSIS — R21 Rash and other nonspecific skin eruption: Secondary | ICD-10-CM | POA: Diagnosis not present

## 2019-07-05 DIAGNOSIS — Z6826 Body mass index (BMI) 26.0-26.9, adult: Secondary | ICD-10-CM | POA: Diagnosis not present

## 2019-07-05 DIAGNOSIS — E1149 Type 2 diabetes mellitus with other diabetic neurological complication: Secondary | ICD-10-CM | POA: Diagnosis not present

## 2019-07-05 DIAGNOSIS — E1165 Type 2 diabetes mellitus with hyperglycemia: Secondary | ICD-10-CM | POA: Diagnosis not present

## 2019-07-15 DIAGNOSIS — G894 Chronic pain syndrome: Secondary | ICD-10-CM | POA: Diagnosis not present

## 2019-07-15 DIAGNOSIS — G629 Polyneuropathy, unspecified: Secondary | ICD-10-CM | POA: Diagnosis not present

## 2019-07-15 DIAGNOSIS — S80922A Unspecified superficial injury of left lower leg, initial encounter: Secondary | ICD-10-CM | POA: Diagnosis not present

## 2019-07-15 DIAGNOSIS — L089 Local infection of the skin and subcutaneous tissue, unspecified: Secondary | ICD-10-CM | POA: Diagnosis not present

## 2019-07-15 DIAGNOSIS — Z6826 Body mass index (BMI) 26.0-26.9, adult: Secondary | ICD-10-CM | POA: Diagnosis not present

## 2019-07-15 DIAGNOSIS — E1149 Type 2 diabetes mellitus with other diabetic neurological complication: Secondary | ICD-10-CM | POA: Diagnosis not present

## 2019-07-15 DIAGNOSIS — E1165 Type 2 diabetes mellitus with hyperglycemia: Secondary | ICD-10-CM | POA: Diagnosis not present

## 2019-08-13 DIAGNOSIS — E1165 Type 2 diabetes mellitus with hyperglycemia: Secondary | ICD-10-CM | POA: Diagnosis not present

## 2019-08-13 DIAGNOSIS — G629 Polyneuropathy, unspecified: Secondary | ICD-10-CM | POA: Diagnosis not present

## 2019-08-13 DIAGNOSIS — L089 Local infection of the skin and subcutaneous tissue, unspecified: Secondary | ICD-10-CM | POA: Diagnosis not present

## 2019-08-13 DIAGNOSIS — S80922A Unspecified superficial injury of left lower leg, initial encounter: Secondary | ICD-10-CM | POA: Diagnosis not present

## 2019-08-13 DIAGNOSIS — G894 Chronic pain syndrome: Secondary | ICD-10-CM | POA: Diagnosis not present

## 2019-08-13 DIAGNOSIS — Z6825 Body mass index (BMI) 25.0-25.9, adult: Secondary | ICD-10-CM | POA: Diagnosis not present

## 2019-08-13 DIAGNOSIS — E1149 Type 2 diabetes mellitus with other diabetic neurological complication: Secondary | ICD-10-CM | POA: Diagnosis not present

## 2019-09-10 DIAGNOSIS — Z6825 Body mass index (BMI) 25.0-25.9, adult: Secondary | ICD-10-CM | POA: Diagnosis not present

## 2019-09-10 DIAGNOSIS — E1149 Type 2 diabetes mellitus with other diabetic neurological complication: Secondary | ICD-10-CM | POA: Diagnosis not present

## 2019-09-10 DIAGNOSIS — E1165 Type 2 diabetes mellitus with hyperglycemia: Secondary | ICD-10-CM | POA: Diagnosis not present

## 2019-09-10 DIAGNOSIS — G894 Chronic pain syndrome: Secondary | ICD-10-CM | POA: Diagnosis not present

## 2019-09-10 DIAGNOSIS — G629 Polyneuropathy, unspecified: Secondary | ICD-10-CM | POA: Diagnosis not present

## 2019-10-08 DIAGNOSIS — G894 Chronic pain syndrome: Secondary | ICD-10-CM | POA: Diagnosis not present

## 2019-10-08 DIAGNOSIS — E1165 Type 2 diabetes mellitus with hyperglycemia: Secondary | ICD-10-CM | POA: Diagnosis not present

## 2019-10-08 DIAGNOSIS — E162 Hypoglycemia, unspecified: Secondary | ICD-10-CM | POA: Diagnosis not present

## 2019-10-08 DIAGNOSIS — Z139 Encounter for screening, unspecified: Secondary | ICD-10-CM | POA: Diagnosis not present

## 2019-10-08 DIAGNOSIS — Z79891 Long term (current) use of opiate analgesic: Secondary | ICD-10-CM | POA: Diagnosis not present

## 2019-10-08 DIAGNOSIS — G629 Polyneuropathy, unspecified: Secondary | ICD-10-CM | POA: Diagnosis not present

## 2019-10-08 DIAGNOSIS — E1149 Type 2 diabetes mellitus with other diabetic neurological complication: Secondary | ICD-10-CM | POA: Diagnosis not present

## 2019-10-27 DIAGNOSIS — Z1331 Encounter for screening for depression: Secondary | ICD-10-CM | POA: Diagnosis not present

## 2019-10-27 DIAGNOSIS — Z9181 History of falling: Secondary | ICD-10-CM | POA: Diagnosis not present

## 2019-10-27 DIAGNOSIS — Z0000 Encounter for general adult medical examination without abnormal findings: Secondary | ICD-10-CM | POA: Diagnosis not present

## 2019-10-27 DIAGNOSIS — E785 Hyperlipidemia, unspecified: Secondary | ICD-10-CM | POA: Diagnosis not present

## 2019-11-04 DIAGNOSIS — G629 Polyneuropathy, unspecified: Secondary | ICD-10-CM | POA: Diagnosis not present

## 2019-11-04 DIAGNOSIS — Z79891 Long term (current) use of opiate analgesic: Secondary | ICD-10-CM | POA: Diagnosis not present

## 2019-11-04 DIAGNOSIS — G894 Chronic pain syndrome: Secondary | ICD-10-CM | POA: Diagnosis not present

## 2019-11-04 DIAGNOSIS — E1149 Type 2 diabetes mellitus with other diabetic neurological complication: Secondary | ICD-10-CM | POA: Diagnosis not present

## 2019-11-04 DIAGNOSIS — E1165 Type 2 diabetes mellitus with hyperglycemia: Secondary | ICD-10-CM | POA: Diagnosis not present

## 2019-11-04 DIAGNOSIS — E162 Hypoglycemia, unspecified: Secondary | ICD-10-CM | POA: Diagnosis not present

## 2019-12-03 DIAGNOSIS — E1165 Type 2 diabetes mellitus with hyperglycemia: Secondary | ICD-10-CM | POA: Diagnosis not present

## 2019-12-03 DIAGNOSIS — E1149 Type 2 diabetes mellitus with other diabetic neurological complication: Secondary | ICD-10-CM | POA: Diagnosis not present

## 2019-12-03 DIAGNOSIS — G894 Chronic pain syndrome: Secondary | ICD-10-CM | POA: Diagnosis not present

## 2019-12-28 DIAGNOSIS — E1149 Type 2 diabetes mellitus with other diabetic neurological complication: Secondary | ICD-10-CM | POA: Diagnosis not present

## 2019-12-28 DIAGNOSIS — G894 Chronic pain syndrome: Secondary | ICD-10-CM | POA: Diagnosis not present

## 2019-12-28 DIAGNOSIS — E1165 Type 2 diabetes mellitus with hyperglycemia: Secondary | ICD-10-CM | POA: Diagnosis not present

## 2020-01-28 DIAGNOSIS — E1165 Type 2 diabetes mellitus with hyperglycemia: Secondary | ICD-10-CM | POA: Diagnosis not present

## 2020-01-28 DIAGNOSIS — Z23 Encounter for immunization: Secondary | ICD-10-CM | POA: Diagnosis not present

## 2020-01-28 DIAGNOSIS — Z6825 Body mass index (BMI) 25.0-25.9, adult: Secondary | ICD-10-CM | POA: Diagnosis not present

## 2020-01-28 DIAGNOSIS — G894 Chronic pain syndrome: Secondary | ICD-10-CM | POA: Diagnosis not present

## 2020-01-28 DIAGNOSIS — E1149 Type 2 diabetes mellitus with other diabetic neurological complication: Secondary | ICD-10-CM | POA: Diagnosis not present

## 2020-01-31 DIAGNOSIS — H1132 Conjunctival hemorrhage, left eye: Secondary | ICD-10-CM | POA: Diagnosis not present

## 2020-01-31 DIAGNOSIS — S0512XA Contusion of eyeball and orbital tissues, left eye, initial encounter: Secondary | ICD-10-CM | POA: Diagnosis not present

## 2020-02-11 DIAGNOSIS — L219 Seborrheic dermatitis, unspecified: Secondary | ICD-10-CM | POA: Diagnosis not present

## 2020-02-25 DIAGNOSIS — G894 Chronic pain syndrome: Secondary | ICD-10-CM | POA: Diagnosis not present

## 2020-02-25 DIAGNOSIS — E785 Hyperlipidemia, unspecified: Secondary | ICD-10-CM | POA: Diagnosis not present

## 2020-02-25 DIAGNOSIS — E1149 Type 2 diabetes mellitus with other diabetic neurological complication: Secondary | ICD-10-CM | POA: Diagnosis not present

## 2020-02-25 DIAGNOSIS — I82511 Chronic embolism and thrombosis of right femoral vein: Secondary | ICD-10-CM | POA: Diagnosis not present

## 2020-02-25 DIAGNOSIS — N1831 Chronic kidney disease, stage 3a: Secondary | ICD-10-CM | POA: Diagnosis not present

## 2020-02-25 DIAGNOSIS — I10 Essential (primary) hypertension: Secondary | ICD-10-CM | POA: Diagnosis not present

## 2020-02-25 DIAGNOSIS — Z6825 Body mass index (BMI) 25.0-25.9, adult: Secondary | ICD-10-CM | POA: Diagnosis not present

## 2020-02-25 DIAGNOSIS — G40909 Epilepsy, unspecified, not intractable, without status epilepticus: Secondary | ICD-10-CM | POA: Diagnosis not present

## 2020-03-24 DIAGNOSIS — G894 Chronic pain syndrome: Secondary | ICD-10-CM | POA: Diagnosis not present

## 2020-03-24 DIAGNOSIS — Z6825 Body mass index (BMI) 25.0-25.9, adult: Secondary | ICD-10-CM | POA: Diagnosis not present

## 2020-04-21 DIAGNOSIS — G894 Chronic pain syndrome: Secondary | ICD-10-CM | POA: Diagnosis not present

## 2020-04-21 DIAGNOSIS — Z79899 Other long term (current) drug therapy: Secondary | ICD-10-CM | POA: Diagnosis not present

## 2020-05-19 DIAGNOSIS — G894 Chronic pain syndrome: Secondary | ICD-10-CM | POA: Diagnosis not present

## 2020-05-19 DIAGNOSIS — Z6824 Body mass index (BMI) 24.0-24.9, adult: Secondary | ICD-10-CM | POA: Diagnosis not present

## 2020-06-16 DIAGNOSIS — N1831 Chronic kidney disease, stage 3a: Secondary | ICD-10-CM | POA: Diagnosis not present

## 2020-06-16 DIAGNOSIS — G40909 Epilepsy, unspecified, not intractable, without status epilepticus: Secondary | ICD-10-CM | POA: Diagnosis not present

## 2020-06-16 DIAGNOSIS — E785 Hyperlipidemia, unspecified: Secondary | ICD-10-CM | POA: Diagnosis not present

## 2020-06-16 DIAGNOSIS — E1149 Type 2 diabetes mellitus with other diabetic neurological complication: Secondary | ICD-10-CM | POA: Diagnosis not present

## 2020-06-16 DIAGNOSIS — Z6825 Body mass index (BMI) 25.0-25.9, adult: Secondary | ICD-10-CM | POA: Diagnosis not present

## 2020-06-16 DIAGNOSIS — G894 Chronic pain syndrome: Secondary | ICD-10-CM | POA: Diagnosis not present

## 2020-06-16 DIAGNOSIS — I82511 Chronic embolism and thrombosis of right femoral vein: Secondary | ICD-10-CM | POA: Diagnosis not present

## 2020-07-14 DIAGNOSIS — Z6825 Body mass index (BMI) 25.0-25.9, adult: Secondary | ICD-10-CM | POA: Diagnosis not present

## 2020-07-14 DIAGNOSIS — G894 Chronic pain syndrome: Secondary | ICD-10-CM | POA: Diagnosis not present

## 2020-07-21 DIAGNOSIS — H5010 Unspecified exotropia: Secondary | ICD-10-CM | POA: Diagnosis not present

## 2020-08-11 DIAGNOSIS — E1149 Type 2 diabetes mellitus with other diabetic neurological complication: Secondary | ICD-10-CM | POA: Diagnosis not present

## 2020-08-11 DIAGNOSIS — H50332 Intermittent monocular exotropia, left eye: Secondary | ICD-10-CM | POA: Diagnosis not present

## 2020-08-11 DIAGNOSIS — G629 Polyneuropathy, unspecified: Secondary | ICD-10-CM | POA: Diagnosis not present

## 2020-08-11 DIAGNOSIS — I10 Essential (primary) hypertension: Secondary | ICD-10-CM | POA: Diagnosis not present

## 2020-08-11 DIAGNOSIS — Z6825 Body mass index (BMI) 25.0-25.9, adult: Secondary | ICD-10-CM | POA: Diagnosis not present

## 2020-08-11 DIAGNOSIS — Z79891 Long term (current) use of opiate analgesic: Secondary | ICD-10-CM | POA: Diagnosis not present

## 2020-08-11 DIAGNOSIS — G894 Chronic pain syndrome: Secondary | ICD-10-CM | POA: Diagnosis not present

## 2020-08-22 DIAGNOSIS — I6523 Occlusion and stenosis of bilateral carotid arteries: Secondary | ICD-10-CM | POA: Diagnosis not present

## 2020-08-22 DIAGNOSIS — H538 Other visual disturbances: Secondary | ICD-10-CM | POA: Diagnosis not present

## 2020-08-22 DIAGNOSIS — H50332 Intermittent monocular exotropia, left eye: Secondary | ICD-10-CM | POA: Diagnosis not present

## 2020-09-08 DIAGNOSIS — E1149 Type 2 diabetes mellitus with other diabetic neurological complication: Secondary | ICD-10-CM | POA: Diagnosis not present

## 2020-09-08 DIAGNOSIS — G629 Polyneuropathy, unspecified: Secondary | ICD-10-CM | POA: Diagnosis not present

## 2020-09-08 DIAGNOSIS — G894 Chronic pain syndrome: Secondary | ICD-10-CM | POA: Diagnosis not present

## 2020-10-06 DIAGNOSIS — E1149 Type 2 diabetes mellitus with other diabetic neurological complication: Secondary | ICD-10-CM | POA: Diagnosis not present

## 2020-10-06 DIAGNOSIS — G894 Chronic pain syndrome: Secondary | ICD-10-CM | POA: Diagnosis not present

## 2020-10-06 DIAGNOSIS — G629 Polyneuropathy, unspecified: Secondary | ICD-10-CM | POA: Diagnosis not present

## 2020-10-30 DIAGNOSIS — Z139 Encounter for screening, unspecified: Secondary | ICD-10-CM | POA: Diagnosis not present

## 2020-10-30 DIAGNOSIS — Z1331 Encounter for screening for depression: Secondary | ICD-10-CM | POA: Diagnosis not present

## 2020-10-30 DIAGNOSIS — Z9181 History of falling: Secondary | ICD-10-CM | POA: Diagnosis not present

## 2020-10-30 DIAGNOSIS — Z0000 Encounter for general adult medical examination without abnormal findings: Secondary | ICD-10-CM | POA: Diagnosis not present

## 2020-10-30 DIAGNOSIS — E785 Hyperlipidemia, unspecified: Secondary | ICD-10-CM | POA: Diagnosis not present

## 2020-12-01 DIAGNOSIS — Z6824 Body mass index (BMI) 24.0-24.9, adult: Secondary | ICD-10-CM | POA: Diagnosis not present

## 2020-12-01 DIAGNOSIS — G629 Polyneuropathy, unspecified: Secondary | ICD-10-CM | POA: Diagnosis not present

## 2020-12-01 DIAGNOSIS — E1149 Type 2 diabetes mellitus with other diabetic neurological complication: Secondary | ICD-10-CM | POA: Diagnosis not present

## 2020-12-01 DIAGNOSIS — G894 Chronic pain syndrome: Secondary | ICD-10-CM | POA: Diagnosis not present

## 2020-12-29 DIAGNOSIS — Z6824 Body mass index (BMI) 24.0-24.9, adult: Secondary | ICD-10-CM | POA: Diagnosis not present

## 2020-12-29 DIAGNOSIS — E1149 Type 2 diabetes mellitus with other diabetic neurological complication: Secondary | ICD-10-CM | POA: Diagnosis not present

## 2020-12-29 DIAGNOSIS — G629 Polyneuropathy, unspecified: Secondary | ICD-10-CM | POA: Diagnosis not present

## 2020-12-29 DIAGNOSIS — G894 Chronic pain syndrome: Secondary | ICD-10-CM | POA: Diagnosis not present

## 2020-12-29 DIAGNOSIS — Z125 Encounter for screening for malignant neoplasm of prostate: Secondary | ICD-10-CM | POA: Diagnosis not present

## 2020-12-29 DIAGNOSIS — E785 Hyperlipidemia, unspecified: Secondary | ICD-10-CM | POA: Diagnosis not present

## 2021-01-26 DIAGNOSIS — Z23 Encounter for immunization: Secondary | ICD-10-CM | POA: Diagnosis not present

## 2021-01-26 DIAGNOSIS — G894 Chronic pain syndrome: Secondary | ICD-10-CM | POA: Diagnosis not present

## 2021-01-26 DIAGNOSIS — Z6825 Body mass index (BMI) 25.0-25.9, adult: Secondary | ICD-10-CM | POA: Diagnosis not present

## 2021-01-26 DIAGNOSIS — I10 Essential (primary) hypertension: Secondary | ICD-10-CM | POA: Diagnosis not present

## 2021-01-26 DIAGNOSIS — G40909 Epilepsy, unspecified, not intractable, without status epilepticus: Secondary | ICD-10-CM | POA: Diagnosis not present

## 2021-01-26 DIAGNOSIS — E1149 Type 2 diabetes mellitus with other diabetic neurological complication: Secondary | ICD-10-CM | POA: Diagnosis not present

## 2021-01-26 DIAGNOSIS — Z125 Encounter for screening for malignant neoplasm of prostate: Secondary | ICD-10-CM | POA: Diagnosis not present

## 2021-01-26 DIAGNOSIS — E785 Hyperlipidemia, unspecified: Secondary | ICD-10-CM | POA: Diagnosis not present

## 2021-01-26 DIAGNOSIS — I82511 Chronic embolism and thrombosis of right femoral vein: Secondary | ICD-10-CM | POA: Diagnosis not present

## 2021-01-26 DIAGNOSIS — N1831 Chronic kidney disease, stage 3a: Secondary | ICD-10-CM | POA: Diagnosis not present

## 2021-02-22 DIAGNOSIS — Z79891 Long term (current) use of opiate analgesic: Secondary | ICD-10-CM | POA: Diagnosis not present

## 2021-02-22 DIAGNOSIS — E1149 Type 2 diabetes mellitus with other diabetic neurological complication: Secondary | ICD-10-CM | POA: Diagnosis not present

## 2021-02-22 DIAGNOSIS — G894 Chronic pain syndrome: Secondary | ICD-10-CM | POA: Diagnosis not present

## 2021-03-23 DIAGNOSIS — G894 Chronic pain syndrome: Secondary | ICD-10-CM | POA: Diagnosis not present

## 2021-03-23 DIAGNOSIS — Z79891 Long term (current) use of opiate analgesic: Secondary | ICD-10-CM | POA: Diagnosis not present

## 2021-03-23 DIAGNOSIS — E1149 Type 2 diabetes mellitus with other diabetic neurological complication: Secondary | ICD-10-CM | POA: Diagnosis not present

## 2021-04-20 DIAGNOSIS — Z79891 Long term (current) use of opiate analgesic: Secondary | ICD-10-CM | POA: Diagnosis not present

## 2021-04-20 DIAGNOSIS — G894 Chronic pain syndrome: Secondary | ICD-10-CM | POA: Diagnosis not present

## 2021-05-21 DIAGNOSIS — Z79891 Long term (current) use of opiate analgesic: Secondary | ICD-10-CM | POA: Diagnosis not present

## 2021-05-21 DIAGNOSIS — G894 Chronic pain syndrome: Secondary | ICD-10-CM | POA: Diagnosis not present

## 2021-05-23 DIAGNOSIS — L219 Seborrheic dermatitis, unspecified: Secondary | ICD-10-CM | POA: Diagnosis not present

## 2021-05-23 DIAGNOSIS — L853 Xerosis cutis: Secondary | ICD-10-CM | POA: Diagnosis not present

## 2021-06-20 DIAGNOSIS — Z79891 Long term (current) use of opiate analgesic: Secondary | ICD-10-CM | POA: Diagnosis not present

## 2021-06-20 DIAGNOSIS — G894 Chronic pain syndrome: Secondary | ICD-10-CM | POA: Diagnosis not present

## 2021-07-19 DIAGNOSIS — I82511 Chronic embolism and thrombosis of right femoral vein: Secondary | ICD-10-CM | POA: Diagnosis not present

## 2021-07-19 DIAGNOSIS — N1831 Chronic kidney disease, stage 3a: Secondary | ICD-10-CM | POA: Diagnosis not present

## 2021-07-19 DIAGNOSIS — E785 Hyperlipidemia, unspecified: Secondary | ICD-10-CM | POA: Diagnosis not present

## 2021-07-19 DIAGNOSIS — G894 Chronic pain syndrome: Secondary | ICD-10-CM | POA: Diagnosis not present

## 2021-07-19 DIAGNOSIS — E1149 Type 2 diabetes mellitus with other diabetic neurological complication: Secondary | ICD-10-CM | POA: Diagnosis not present

## 2021-07-19 DIAGNOSIS — G629 Polyneuropathy, unspecified: Secondary | ICD-10-CM | POA: Diagnosis not present

## 2021-07-19 DIAGNOSIS — G40909 Epilepsy, unspecified, not intractable, without status epilepticus: Secondary | ICD-10-CM | POA: Diagnosis not present

## 2021-07-24 DIAGNOSIS — E876 Hypokalemia: Secondary | ICD-10-CM | POA: Diagnosis not present

## 2021-08-02 DIAGNOSIS — E876 Hypokalemia: Secondary | ICD-10-CM | POA: Diagnosis not present

## 2021-08-17 DIAGNOSIS — E1149 Type 2 diabetes mellitus with other diabetic neurological complication: Secondary | ICD-10-CM | POA: Diagnosis not present

## 2021-08-17 DIAGNOSIS — G894 Chronic pain syndrome: Secondary | ICD-10-CM | POA: Diagnosis not present

## 2021-09-14 DIAGNOSIS — G894 Chronic pain syndrome: Secondary | ICD-10-CM | POA: Diagnosis not present

## 2021-09-14 DIAGNOSIS — E1149 Type 2 diabetes mellitus with other diabetic neurological complication: Secondary | ICD-10-CM | POA: Diagnosis not present

## 2021-10-09 DIAGNOSIS — E1149 Type 2 diabetes mellitus with other diabetic neurological complication: Secondary | ICD-10-CM | POA: Diagnosis not present

## 2021-10-09 DIAGNOSIS — G894 Chronic pain syndrome: Secondary | ICD-10-CM | POA: Diagnosis not present

## 2021-11-12 DIAGNOSIS — I10 Essential (primary) hypertension: Secondary | ICD-10-CM | POA: Diagnosis not present

## 2021-11-12 DIAGNOSIS — R42 Dizziness and giddiness: Secondary | ICD-10-CM | POA: Diagnosis not present

## 2021-11-12 DIAGNOSIS — E876 Hypokalemia: Secondary | ICD-10-CM | POA: Diagnosis not present

## 2021-11-12 DIAGNOSIS — E860 Dehydration: Secondary | ICD-10-CM | POA: Diagnosis not present

## 2021-11-12 DIAGNOSIS — M545 Low back pain, unspecified: Secondary | ICD-10-CM | POA: Diagnosis not present

## 2021-11-12 DIAGNOSIS — G8929 Other chronic pain: Secondary | ICD-10-CM | POA: Diagnosis not present

## 2021-11-12 DIAGNOSIS — Z20822 Contact with and (suspected) exposure to covid-19: Secondary | ICD-10-CM | POA: Diagnosis not present

## 2021-11-12 DIAGNOSIS — I491 Atrial premature depolarization: Secondary | ICD-10-CM | POA: Diagnosis not present

## 2021-11-15 DIAGNOSIS — E876 Hypokalemia: Secondary | ICD-10-CM | POA: Diagnosis not present

## 2021-11-15 DIAGNOSIS — E1149 Type 2 diabetes mellitus with other diabetic neurological complication: Secondary | ICD-10-CM | POA: Diagnosis not present

## 2021-12-17 DIAGNOSIS — E1149 Type 2 diabetes mellitus with other diabetic neurological complication: Secondary | ICD-10-CM | POA: Diagnosis not present

## 2021-12-17 DIAGNOSIS — G894 Chronic pain syndrome: Secondary | ICD-10-CM | POA: Diagnosis not present

## 2022-01-08 DIAGNOSIS — E876 Hypokalemia: Secondary | ICD-10-CM | POA: Diagnosis not present

## 2022-01-08 DIAGNOSIS — K219 Gastro-esophageal reflux disease without esophagitis: Secondary | ICD-10-CM | POA: Diagnosis not present

## 2022-01-08 DIAGNOSIS — G629 Polyneuropathy, unspecified: Secondary | ICD-10-CM | POA: Diagnosis not present

## 2022-01-08 DIAGNOSIS — E785 Hyperlipidemia, unspecified: Secondary | ICD-10-CM | POA: Diagnosis not present

## 2022-01-08 DIAGNOSIS — G40909 Epilepsy, unspecified, not intractable, without status epilepticus: Secondary | ICD-10-CM | POA: Diagnosis not present

## 2022-01-08 DIAGNOSIS — I82511 Chronic embolism and thrombosis of right femoral vein: Secondary | ICD-10-CM | POA: Diagnosis not present

## 2022-01-08 DIAGNOSIS — N1831 Chronic kidney disease, stage 3a: Secondary | ICD-10-CM | POA: Diagnosis not present

## 2022-01-08 DIAGNOSIS — Z79891 Long term (current) use of opiate analgesic: Secondary | ICD-10-CM | POA: Diagnosis not present

## 2022-01-08 DIAGNOSIS — E1149 Type 2 diabetes mellitus with other diabetic neurological complication: Secondary | ICD-10-CM | POA: Diagnosis not present

## 2022-01-25 DIAGNOSIS — Z9181 History of falling: Secondary | ICD-10-CM | POA: Diagnosis not present

## 2022-01-25 DIAGNOSIS — E785 Hyperlipidemia, unspecified: Secondary | ICD-10-CM | POA: Diagnosis not present

## 2022-01-25 DIAGNOSIS — Z1331 Encounter for screening for depression: Secondary | ICD-10-CM | POA: Diagnosis not present

## 2022-01-25 DIAGNOSIS — Z0000 Encounter for general adult medical examination without abnormal findings: Secondary | ICD-10-CM | POA: Diagnosis not present

## 2022-02-07 DIAGNOSIS — Z23 Encounter for immunization: Secondary | ICD-10-CM | POA: Diagnosis not present

## 2022-02-07 DIAGNOSIS — G894 Chronic pain syndrome: Secondary | ICD-10-CM | POA: Diagnosis not present

## 2022-02-08 DIAGNOSIS — R0902 Hypoxemia: Secondary | ICD-10-CM | POA: Diagnosis not present

## 2022-02-08 DIAGNOSIS — Z7982 Long term (current) use of aspirin: Secondary | ICD-10-CM | POA: Diagnosis not present

## 2022-02-08 DIAGNOSIS — R9431 Abnormal electrocardiogram [ECG] [EKG]: Secondary | ICD-10-CM | POA: Diagnosis not present

## 2022-02-08 DIAGNOSIS — Z79899 Other long term (current) drug therapy: Secondary | ICD-10-CM | POA: Diagnosis not present

## 2022-02-08 DIAGNOSIS — R400 Somnolence: Secondary | ICD-10-CM | POA: Diagnosis not present

## 2022-02-08 DIAGNOSIS — I10 Essential (primary) hypertension: Secondary | ICD-10-CM | POA: Diagnosis not present

## 2022-02-08 DIAGNOSIS — F1190 Opioid use, unspecified, uncomplicated: Secondary | ICD-10-CM | POA: Diagnosis not present

## 2022-02-08 DIAGNOSIS — R4182 Altered mental status, unspecified: Secondary | ICD-10-CM | POA: Diagnosis not present

## 2022-02-08 DIAGNOSIS — G4710 Hypersomnia, unspecified: Secondary | ICD-10-CM | POA: Diagnosis not present

## 2022-02-08 DIAGNOSIS — R456 Violent behavior: Secondary | ICD-10-CM | POA: Diagnosis not present

## 2022-02-15 DIAGNOSIS — T402X1A Poisoning by other opioids, accidental (unintentional), initial encounter: Secondary | ICD-10-CM | POA: Diagnosis not present

## 2022-02-15 DIAGNOSIS — R400 Somnolence: Secondary | ICD-10-CM | POA: Diagnosis not present

## 2022-02-15 DIAGNOSIS — G894 Chronic pain syndrome: Secondary | ICD-10-CM | POA: Diagnosis not present

## 2022-02-15 DIAGNOSIS — S80922A Unspecified superficial injury of left lower leg, initial encounter: Secondary | ICD-10-CM | POA: Diagnosis not present

## 2022-03-08 DIAGNOSIS — Z79899 Other long term (current) drug therapy: Secondary | ICD-10-CM | POA: Diagnosis not present

## 2022-03-08 DIAGNOSIS — G894 Chronic pain syndrome: Secondary | ICD-10-CM | POA: Diagnosis not present

## 2022-04-05 DIAGNOSIS — E1149 Type 2 diabetes mellitus with other diabetic neurological complication: Secondary | ICD-10-CM | POA: Diagnosis not present

## 2022-04-05 DIAGNOSIS — G894 Chronic pain syndrome: Secondary | ICD-10-CM | POA: Diagnosis not present

## 2022-04-05 DIAGNOSIS — Z79899 Other long term (current) drug therapy: Secondary | ICD-10-CM | POA: Diagnosis not present

## 2022-05-03 DIAGNOSIS — E1149 Type 2 diabetes mellitus with other diabetic neurological complication: Secondary | ICD-10-CM | POA: Diagnosis not present

## 2022-05-03 DIAGNOSIS — G894 Chronic pain syndrome: Secondary | ICD-10-CM | POA: Diagnosis not present

## 2022-05-31 DIAGNOSIS — Z79891 Long term (current) use of opiate analgesic: Secondary | ICD-10-CM | POA: Diagnosis not present

## 2022-05-31 DIAGNOSIS — G894 Chronic pain syndrome: Secondary | ICD-10-CM | POA: Diagnosis not present

## 2022-05-31 DIAGNOSIS — E1149 Type 2 diabetes mellitus with other diabetic neurological complication: Secondary | ICD-10-CM | POA: Diagnosis not present

## 2022-06-28 DIAGNOSIS — E1149 Type 2 diabetes mellitus with other diabetic neurological complication: Secondary | ICD-10-CM | POA: Diagnosis not present

## 2022-06-28 DIAGNOSIS — G894 Chronic pain syndrome: Secondary | ICD-10-CM | POA: Diagnosis not present

## 2022-07-26 DIAGNOSIS — E1149 Type 2 diabetes mellitus with other diabetic neurological complication: Secondary | ICD-10-CM | POA: Diagnosis not present

## 2022-07-26 DIAGNOSIS — G894 Chronic pain syndrome: Secondary | ICD-10-CM | POA: Diagnosis not present

## 2022-08-23 DIAGNOSIS — E785 Hyperlipidemia, unspecified: Secondary | ICD-10-CM | POA: Diagnosis not present

## 2022-08-23 DIAGNOSIS — G629 Polyneuropathy, unspecified: Secondary | ICD-10-CM | POA: Diagnosis not present

## 2022-08-23 DIAGNOSIS — I10 Essential (primary) hypertension: Secondary | ICD-10-CM | POA: Diagnosis not present

## 2022-08-23 DIAGNOSIS — E1149 Type 2 diabetes mellitus with other diabetic neurological complication: Secondary | ICD-10-CM | POA: Diagnosis not present

## 2022-08-23 DIAGNOSIS — I82511 Chronic embolism and thrombosis of right femoral vein: Secondary | ICD-10-CM | POA: Diagnosis not present

## 2022-08-23 DIAGNOSIS — G894 Chronic pain syndrome: Secondary | ICD-10-CM | POA: Diagnosis not present

## 2022-08-23 DIAGNOSIS — N1831 Chronic kidney disease, stage 3a: Secondary | ICD-10-CM | POA: Diagnosis not present

## 2022-09-20 DIAGNOSIS — E1149 Type 2 diabetes mellitus with other diabetic neurological complication: Secondary | ICD-10-CM | POA: Diagnosis not present

## 2022-09-20 DIAGNOSIS — L97919 Non-pressure chronic ulcer of unspecified part of right lower leg with unspecified severity: Secondary | ICD-10-CM | POA: Diagnosis not present

## 2022-09-20 DIAGNOSIS — M7021 Olecranon bursitis, right elbow: Secondary | ICD-10-CM | POA: Diagnosis not present

## 2022-09-20 DIAGNOSIS — R238 Other skin changes: Secondary | ICD-10-CM | POA: Diagnosis not present

## 2022-09-20 DIAGNOSIS — I739 Peripheral vascular disease, unspecified: Secondary | ICD-10-CM | POA: Diagnosis not present

## 2022-09-20 DIAGNOSIS — G894 Chronic pain syndrome: Secondary | ICD-10-CM | POA: Diagnosis not present

## 2022-09-25 DIAGNOSIS — L97819 Non-pressure chronic ulcer of other part of right lower leg with unspecified severity: Secondary | ICD-10-CM | POA: Diagnosis not present

## 2022-09-25 DIAGNOSIS — I743 Embolism and thrombosis of arteries of the lower extremities: Secondary | ICD-10-CM | POA: Diagnosis not present

## 2022-09-25 DIAGNOSIS — N200 Calculus of kidney: Secondary | ICD-10-CM | POA: Diagnosis not present

## 2022-09-25 DIAGNOSIS — I739 Peripheral vascular disease, unspecified: Secondary | ICD-10-CM | POA: Diagnosis not present

## 2022-09-25 DIAGNOSIS — I700 Atherosclerosis of aorta: Secondary | ICD-10-CM | POA: Diagnosis not present

## 2022-09-25 DIAGNOSIS — K5730 Diverticulosis of large intestine without perforation or abscess without bleeding: Secondary | ICD-10-CM | POA: Diagnosis not present

## 2022-09-25 DIAGNOSIS — I83018 Varicose veins of right lower extremity with ulcer other part of lower leg: Secondary | ICD-10-CM | POA: Diagnosis not present

## 2022-09-25 DIAGNOSIS — L97329 Non-pressure chronic ulcer of left ankle with unspecified severity: Secondary | ICD-10-CM | POA: Diagnosis not present

## 2022-09-25 DIAGNOSIS — I82511 Chronic embolism and thrombosis of right femoral vein: Secondary | ICD-10-CM | POA: Diagnosis not present

## 2022-09-26 DIAGNOSIS — I83019 Varicose veins of right lower extremity with ulcer of unspecified site: Secondary | ICD-10-CM | POA: Diagnosis not present

## 2022-09-26 DIAGNOSIS — L97919 Non-pressure chronic ulcer of unspecified part of right lower leg with unspecified severity: Secondary | ICD-10-CM | POA: Diagnosis not present

## 2022-10-03 DIAGNOSIS — I83019 Varicose veins of right lower extremity with ulcer of unspecified site: Secondary | ICD-10-CM | POA: Diagnosis not present

## 2022-10-03 DIAGNOSIS — L97919 Non-pressure chronic ulcer of unspecified part of right lower leg with unspecified severity: Secondary | ICD-10-CM | POA: Diagnosis not present

## 2022-10-10 DIAGNOSIS — L97919 Non-pressure chronic ulcer of unspecified part of right lower leg with unspecified severity: Secondary | ICD-10-CM | POA: Diagnosis not present

## 2022-10-10 DIAGNOSIS — I83019 Varicose veins of right lower extremity with ulcer of unspecified site: Secondary | ICD-10-CM | POA: Diagnosis not present

## 2022-10-17 DIAGNOSIS — I83019 Varicose veins of right lower extremity with ulcer of unspecified site: Secondary | ICD-10-CM | POA: Diagnosis not present

## 2022-10-17 DIAGNOSIS — L97919 Non-pressure chronic ulcer of unspecified part of right lower leg with unspecified severity: Secondary | ICD-10-CM | POA: Diagnosis not present

## 2022-10-18 DIAGNOSIS — E1149 Type 2 diabetes mellitus with other diabetic neurological complication: Secondary | ICD-10-CM | POA: Diagnosis not present

## 2022-10-18 DIAGNOSIS — L97919 Non-pressure chronic ulcer of unspecified part of right lower leg with unspecified severity: Secondary | ICD-10-CM | POA: Diagnosis not present

## 2022-10-18 DIAGNOSIS — I83019 Varicose veins of right lower extremity with ulcer of unspecified site: Secondary | ICD-10-CM | POA: Diagnosis not present

## 2022-10-18 DIAGNOSIS — G894 Chronic pain syndrome: Secondary | ICD-10-CM | POA: Diagnosis not present

## 2022-10-23 DIAGNOSIS — Z7901 Long term (current) use of anticoagulants: Secondary | ICD-10-CM | POA: Diagnosis not present

## 2022-10-23 DIAGNOSIS — T783XXA Angioneurotic edema, initial encounter: Secondary | ICD-10-CM | POA: Diagnosis not present

## 2022-10-25 DIAGNOSIS — L97919 Non-pressure chronic ulcer of unspecified part of right lower leg with unspecified severity: Secondary | ICD-10-CM | POA: Diagnosis not present

## 2022-10-25 DIAGNOSIS — I83019 Varicose veins of right lower extremity with ulcer of unspecified site: Secondary | ICD-10-CM | POA: Diagnosis not present

## 2022-11-01 DIAGNOSIS — I83019 Varicose veins of right lower extremity with ulcer of unspecified site: Secondary | ICD-10-CM | POA: Diagnosis not present

## 2022-11-01 DIAGNOSIS — L97919 Non-pressure chronic ulcer of unspecified part of right lower leg with unspecified severity: Secondary | ICD-10-CM | POA: Diagnosis not present

## 2022-11-08 DIAGNOSIS — I83019 Varicose veins of right lower extremity with ulcer of unspecified site: Secondary | ICD-10-CM | POA: Diagnosis not present

## 2022-11-08 DIAGNOSIS — L97919 Non-pressure chronic ulcer of unspecified part of right lower leg with unspecified severity: Secondary | ICD-10-CM | POA: Diagnosis not present

## 2022-11-12 DIAGNOSIS — I83019 Varicose veins of right lower extremity with ulcer of unspecified site: Secondary | ICD-10-CM | POA: Diagnosis not present

## 2022-11-12 DIAGNOSIS — L97919 Non-pressure chronic ulcer of unspecified part of right lower leg with unspecified severity: Secondary | ICD-10-CM | POA: Diagnosis not present

## 2022-11-15 DIAGNOSIS — L97919 Non-pressure chronic ulcer of unspecified part of right lower leg with unspecified severity: Secondary | ICD-10-CM | POA: Diagnosis not present

## 2022-11-15 DIAGNOSIS — G894 Chronic pain syndrome: Secondary | ICD-10-CM | POA: Diagnosis not present

## 2022-11-15 DIAGNOSIS — I83019 Varicose veins of right lower extremity with ulcer of unspecified site: Secondary | ICD-10-CM | POA: Diagnosis not present

## 2022-11-18 DIAGNOSIS — E1149 Type 2 diabetes mellitus with other diabetic neurological complication: Secondary | ICD-10-CM | POA: Diagnosis not present

## 2022-11-22 DIAGNOSIS — L97919 Non-pressure chronic ulcer of unspecified part of right lower leg with unspecified severity: Secondary | ICD-10-CM | POA: Diagnosis not present

## 2022-11-22 DIAGNOSIS — I83019 Varicose veins of right lower extremity with ulcer of unspecified site: Secondary | ICD-10-CM | POA: Diagnosis not present

## 2022-11-27 DIAGNOSIS — L97919 Non-pressure chronic ulcer of unspecified part of right lower leg with unspecified severity: Secondary | ICD-10-CM | POA: Diagnosis not present

## 2022-11-27 DIAGNOSIS — I83019 Varicose veins of right lower extremity with ulcer of unspecified site: Secondary | ICD-10-CM | POA: Diagnosis not present

## 2022-12-09 DIAGNOSIS — I83019 Varicose veins of right lower extremity with ulcer of unspecified site: Secondary | ICD-10-CM | POA: Diagnosis not present

## 2022-12-09 DIAGNOSIS — L97919 Non-pressure chronic ulcer of unspecified part of right lower leg with unspecified severity: Secondary | ICD-10-CM | POA: Diagnosis not present

## 2022-12-13 DIAGNOSIS — E1149 Type 2 diabetes mellitus with other diabetic neurological complication: Secondary | ICD-10-CM | POA: Diagnosis not present

## 2022-12-13 DIAGNOSIS — I83019 Varicose veins of right lower extremity with ulcer of unspecified site: Secondary | ICD-10-CM | POA: Diagnosis not present

## 2022-12-13 DIAGNOSIS — L97919 Non-pressure chronic ulcer of unspecified part of right lower leg with unspecified severity: Secondary | ICD-10-CM | POA: Diagnosis not present

## 2022-12-13 DIAGNOSIS — G894 Chronic pain syndrome: Secondary | ICD-10-CM | POA: Diagnosis not present

## 2023-01-20 DIAGNOSIS — Z6825 Body mass index (BMI) 25.0-25.9, adult: Secondary | ICD-10-CM | POA: Diagnosis not present

## 2023-01-20 DIAGNOSIS — G894 Chronic pain syndrome: Secondary | ICD-10-CM | POA: Diagnosis not present

## 2023-01-20 DIAGNOSIS — E1149 Type 2 diabetes mellitus with other diabetic neurological complication: Secondary | ICD-10-CM | POA: Diagnosis not present

## 2023-02-20 DIAGNOSIS — E785 Hyperlipidemia, unspecified: Secondary | ICD-10-CM | POA: Diagnosis not present

## 2023-02-20 DIAGNOSIS — G894 Chronic pain syndrome: Secondary | ICD-10-CM | POA: Diagnosis not present

## 2023-02-20 DIAGNOSIS — G2581 Restless legs syndrome: Secondary | ICD-10-CM | POA: Diagnosis not present

## 2023-02-20 DIAGNOSIS — G629 Polyneuropathy, unspecified: Secondary | ICD-10-CM | POA: Diagnosis not present

## 2023-02-20 DIAGNOSIS — K5903 Drug induced constipation: Secondary | ICD-10-CM | POA: Diagnosis not present

## 2023-02-20 DIAGNOSIS — Z23 Encounter for immunization: Secondary | ICD-10-CM | POA: Diagnosis not present

## 2023-02-20 DIAGNOSIS — Z6825 Body mass index (BMI) 25.0-25.9, adult: Secondary | ICD-10-CM | POA: Diagnosis not present

## 2023-02-20 DIAGNOSIS — E1149 Type 2 diabetes mellitus with other diabetic neurological complication: Secondary | ICD-10-CM | POA: Diagnosis not present

## 2023-02-20 DIAGNOSIS — I82511 Chronic embolism and thrombosis of right femoral vein: Secondary | ICD-10-CM | POA: Diagnosis not present

## 2023-03-07 DIAGNOSIS — Z79899 Other long term (current) drug therapy: Secondary | ICD-10-CM | POA: Diagnosis not present

## 2023-03-07 DIAGNOSIS — G894 Chronic pain syndrome: Secondary | ICD-10-CM | POA: Diagnosis not present

## 2023-03-07 DIAGNOSIS — E1149 Type 2 diabetes mellitus with other diabetic neurological complication: Secondary | ICD-10-CM | POA: Diagnosis not present

## 2023-03-07 DIAGNOSIS — Z6823 Body mass index (BMI) 23.0-23.9, adult: Secondary | ICD-10-CM | POA: Diagnosis not present

## 2023-03-31 DIAGNOSIS — E1149 Type 2 diabetes mellitus with other diabetic neurological complication: Secondary | ICD-10-CM | POA: Diagnosis not present

## 2023-03-31 DIAGNOSIS — Z6825 Body mass index (BMI) 25.0-25.9, adult: Secondary | ICD-10-CM | POA: Diagnosis not present

## 2023-03-31 DIAGNOSIS — G894 Chronic pain syndrome: Secondary | ICD-10-CM | POA: Diagnosis not present

## 2023-05-30 DIAGNOSIS — G629 Polyneuropathy, unspecified: Secondary | ICD-10-CM | POA: Diagnosis not present

## 2023-05-30 DIAGNOSIS — Z6823 Body mass index (BMI) 23.0-23.9, adult: Secondary | ICD-10-CM | POA: Diagnosis not present

## 2023-05-30 DIAGNOSIS — K5903 Drug induced constipation: Secondary | ICD-10-CM | POA: Diagnosis not present

## 2023-05-30 DIAGNOSIS — E1149 Type 2 diabetes mellitus with other diabetic neurological complication: Secondary | ICD-10-CM | POA: Diagnosis not present

## 2023-05-30 DIAGNOSIS — G894 Chronic pain syndrome: Secondary | ICD-10-CM | POA: Diagnosis not present

## 2023-05-30 DIAGNOSIS — T402X5A Adverse effect of other opioids, initial encounter: Secondary | ICD-10-CM | POA: Diagnosis not present

## 2023-06-27 DIAGNOSIS — T402X5A Adverse effect of other opioids, initial encounter: Secondary | ICD-10-CM | POA: Diagnosis not present

## 2023-06-27 DIAGNOSIS — E1149 Type 2 diabetes mellitus with other diabetic neurological complication: Secondary | ICD-10-CM | POA: Diagnosis not present

## 2023-06-27 DIAGNOSIS — K5903 Drug induced constipation: Secondary | ICD-10-CM | POA: Diagnosis not present

## 2023-06-27 DIAGNOSIS — G894 Chronic pain syndrome: Secondary | ICD-10-CM | POA: Diagnosis not present

## 2023-06-27 DIAGNOSIS — Z6823 Body mass index (BMI) 23.0-23.9, adult: Secondary | ICD-10-CM | POA: Diagnosis not present

## 2023-06-27 DIAGNOSIS — G629 Polyneuropathy, unspecified: Secondary | ICD-10-CM | POA: Diagnosis not present

## 2023-07-25 DIAGNOSIS — E1149 Type 2 diabetes mellitus with other diabetic neurological complication: Secondary | ICD-10-CM | POA: Diagnosis not present

## 2023-07-25 DIAGNOSIS — K5903 Drug induced constipation: Secondary | ICD-10-CM | POA: Diagnosis not present

## 2023-07-25 DIAGNOSIS — G894 Chronic pain syndrome: Secondary | ICD-10-CM | POA: Diagnosis not present

## 2023-07-25 DIAGNOSIS — G629 Polyneuropathy, unspecified: Secondary | ICD-10-CM | POA: Diagnosis not present

## 2023-07-25 DIAGNOSIS — T402X5A Adverse effect of other opioids, initial encounter: Secondary | ICD-10-CM | POA: Diagnosis not present

## 2023-07-25 DIAGNOSIS — Z6825 Body mass index (BMI) 25.0-25.9, adult: Secondary | ICD-10-CM | POA: Diagnosis not present

## 2023-08-18 DIAGNOSIS — E119 Type 2 diabetes mellitus without complications: Secondary | ICD-10-CM | POA: Diagnosis not present

## 2023-08-20 DIAGNOSIS — Z0000 Encounter for general adult medical examination without abnormal findings: Secondary | ICD-10-CM | POA: Diagnosis not present

## 2023-08-20 DIAGNOSIS — Z9181 History of falling: Secondary | ICD-10-CM | POA: Diagnosis not present

## 2023-08-29 DIAGNOSIS — I10 Essential (primary) hypertension: Secondary | ICD-10-CM | POA: Diagnosis not present

## 2023-08-29 DIAGNOSIS — E1149 Type 2 diabetes mellitus with other diabetic neurological complication: Secondary | ICD-10-CM | POA: Diagnosis not present

## 2023-08-29 DIAGNOSIS — G629 Polyneuropathy, unspecified: Secondary | ICD-10-CM | POA: Diagnosis not present

## 2023-08-29 DIAGNOSIS — G40909 Epilepsy, unspecified, not intractable, without status epilepticus: Secondary | ICD-10-CM | POA: Diagnosis not present

## 2023-08-29 DIAGNOSIS — T402X5A Adverse effect of other opioids, initial encounter: Secondary | ICD-10-CM | POA: Diagnosis not present

## 2023-08-29 DIAGNOSIS — G894 Chronic pain syndrome: Secondary | ICD-10-CM | POA: Diagnosis not present

## 2023-08-29 DIAGNOSIS — Z6825 Body mass index (BMI) 25.0-25.9, adult: Secondary | ICD-10-CM | POA: Diagnosis not present

## 2023-08-29 DIAGNOSIS — I82511 Chronic embolism and thrombosis of right femoral vein: Secondary | ICD-10-CM | POA: Diagnosis not present

## 2023-08-29 DIAGNOSIS — K5903 Drug induced constipation: Secondary | ICD-10-CM | POA: Diagnosis not present

## 2023-09-09 DIAGNOSIS — H25813 Combined forms of age-related cataract, bilateral: Secondary | ICD-10-CM | POA: Diagnosis not present

## 2023-09-09 DIAGNOSIS — H40033 Anatomical narrow angle, bilateral: Secondary | ICD-10-CM | POA: Diagnosis not present

## 2023-09-26 DIAGNOSIS — I10 Essential (primary) hypertension: Secondary | ICD-10-CM | POA: Diagnosis not present

## 2023-09-26 DIAGNOSIS — G40909 Epilepsy, unspecified, not intractable, without status epilepticus: Secondary | ICD-10-CM | POA: Diagnosis not present

## 2023-09-26 DIAGNOSIS — G894 Chronic pain syndrome: Secondary | ICD-10-CM | POA: Diagnosis not present

## 2023-10-28 DIAGNOSIS — Z6824 Body mass index (BMI) 24.0-24.9, adult: Secondary | ICD-10-CM | POA: Diagnosis not present

## 2023-10-28 DIAGNOSIS — G40909 Epilepsy, unspecified, not intractable, without status epilepticus: Secondary | ICD-10-CM | POA: Diagnosis not present

## 2023-10-28 DIAGNOSIS — E1149 Type 2 diabetes mellitus with other diabetic neurological complication: Secondary | ICD-10-CM | POA: Diagnosis not present

## 2023-10-28 DIAGNOSIS — G894 Chronic pain syndrome: Secondary | ICD-10-CM | POA: Diagnosis not present

## 2023-10-28 DIAGNOSIS — I10 Essential (primary) hypertension: Secondary | ICD-10-CM | POA: Diagnosis not present

## 2023-10-28 DIAGNOSIS — G629 Polyneuropathy, unspecified: Secondary | ICD-10-CM | POA: Diagnosis not present

## 2023-11-27 DIAGNOSIS — G894 Chronic pain syndrome: Secondary | ICD-10-CM | POA: Diagnosis not present

## 2023-11-27 DIAGNOSIS — G40909 Epilepsy, unspecified, not intractable, without status epilepticus: Secondary | ICD-10-CM | POA: Diagnosis not present

## 2023-11-27 DIAGNOSIS — I10 Essential (primary) hypertension: Secondary | ICD-10-CM | POA: Diagnosis not present

## 2023-11-27 DIAGNOSIS — G629 Polyneuropathy, unspecified: Secondary | ICD-10-CM | POA: Diagnosis not present

## 2023-11-27 DIAGNOSIS — E1149 Type 2 diabetes mellitus with other diabetic neurological complication: Secondary | ICD-10-CM | POA: Diagnosis not present

## 2023-11-27 DIAGNOSIS — Z6824 Body mass index (BMI) 24.0-24.9, adult: Secondary | ICD-10-CM | POA: Diagnosis not present

## 2024-01-10 DIAGNOSIS — S72011A Unspecified intracapsular fracture of right femur, initial encounter for closed fracture: Secondary | ICD-10-CM | POA: Diagnosis not present

## 2024-01-10 DIAGNOSIS — E119 Type 2 diabetes mellitus without complications: Secondary | ICD-10-CM | POA: Diagnosis not present

## 2024-01-10 DIAGNOSIS — S72001A Fracture of unspecified part of neck of right femur, initial encounter for closed fracture: Secondary | ICD-10-CM | POA: Diagnosis not present

## 2024-01-10 DIAGNOSIS — I959 Hypotension, unspecified: Secondary | ICD-10-CM | POA: Diagnosis not present

## 2024-01-10 DIAGNOSIS — E7800 Pure hypercholesterolemia, unspecified: Secondary | ICD-10-CM | POA: Diagnosis not present

## 2024-01-10 DIAGNOSIS — R531 Weakness: Secondary | ICD-10-CM | POA: Diagnosis not present

## 2024-01-10 DIAGNOSIS — I10 Essential (primary) hypertension: Secondary | ICD-10-CM | POA: Diagnosis not present

## 2024-01-10 DIAGNOSIS — W01198A Fall on same level from slipping, tripping and stumbling with subsequent striking against other object, initial encounter: Secondary | ICD-10-CM | POA: Diagnosis not present

## 2024-01-10 DIAGNOSIS — W19XXXA Unspecified fall, initial encounter: Secondary | ICD-10-CM | POA: Diagnosis not present

## 2024-01-11 DIAGNOSIS — S72011A Unspecified intracapsular fracture of right femur, initial encounter for closed fracture: Secondary | ICD-10-CM | POA: Diagnosis not present

## 2024-01-11 DIAGNOSIS — Z96641 Presence of right artificial hip joint: Secondary | ICD-10-CM | POA: Diagnosis not present

## 2024-01-11 DIAGNOSIS — W19XXXA Unspecified fall, initial encounter: Secondary | ICD-10-CM | POA: Diagnosis not present

## 2024-01-11 DIAGNOSIS — E7800 Pure hypercholesterolemia, unspecified: Secondary | ICD-10-CM | POA: Diagnosis not present

## 2024-01-11 DIAGNOSIS — S72001A Fracture of unspecified part of neck of right femur, initial encounter for closed fracture: Secondary | ICD-10-CM | POA: Diagnosis not present

## 2024-01-11 DIAGNOSIS — I10 Essential (primary) hypertension: Secondary | ICD-10-CM | POA: Diagnosis not present

## 2024-01-12 DIAGNOSIS — E7800 Pure hypercholesterolemia, unspecified: Secondary | ICD-10-CM | POA: Diagnosis not present

## 2024-01-12 DIAGNOSIS — S72011A Unspecified intracapsular fracture of right femur, initial encounter for closed fracture: Secondary | ICD-10-CM | POA: Diagnosis not present

## 2024-01-12 DIAGNOSIS — I10 Essential (primary) hypertension: Secondary | ICD-10-CM | POA: Diagnosis not present

## 2024-01-13 DIAGNOSIS — E7800 Pure hypercholesterolemia, unspecified: Secondary | ICD-10-CM | POA: Diagnosis not present

## 2024-01-13 DIAGNOSIS — S72011A Unspecified intracapsular fracture of right femur, initial encounter for closed fracture: Secondary | ICD-10-CM | POA: Diagnosis not present

## 2024-01-13 DIAGNOSIS — I10 Essential (primary) hypertension: Secondary | ICD-10-CM | POA: Diagnosis not present

## 2024-01-14 DIAGNOSIS — I10 Essential (primary) hypertension: Secondary | ICD-10-CM | POA: Diagnosis not present

## 2024-01-14 DIAGNOSIS — S72011A Unspecified intracapsular fracture of right femur, initial encounter for closed fracture: Secondary | ICD-10-CM | POA: Diagnosis not present

## 2024-01-14 DIAGNOSIS — E7800 Pure hypercholesterolemia, unspecified: Secondary | ICD-10-CM | POA: Diagnosis not present

## 2024-01-15 DIAGNOSIS — I10 Essential (primary) hypertension: Secondary | ICD-10-CM | POA: Diagnosis not present

## 2024-01-15 DIAGNOSIS — E7800 Pure hypercholesterolemia, unspecified: Secondary | ICD-10-CM | POA: Diagnosis not present

## 2024-01-15 DIAGNOSIS — S72011A Unspecified intracapsular fracture of right femur, initial encounter for closed fracture: Secondary | ICD-10-CM | POA: Diagnosis not present

## 2024-01-16 DIAGNOSIS — E7800 Pure hypercholesterolemia, unspecified: Secondary | ICD-10-CM | POA: Diagnosis not present

## 2024-01-16 DIAGNOSIS — S72011A Unspecified intracapsular fracture of right femur, initial encounter for closed fracture: Secondary | ICD-10-CM | POA: Diagnosis not present

## 2024-01-16 DIAGNOSIS — I10 Essential (primary) hypertension: Secondary | ICD-10-CM | POA: Diagnosis not present

## 2024-01-17 DIAGNOSIS — E7800 Pure hypercholesterolemia, unspecified: Secondary | ICD-10-CM | POA: Diagnosis not present

## 2024-01-17 DIAGNOSIS — S72011A Unspecified intracapsular fracture of right femur, initial encounter for closed fracture: Secondary | ICD-10-CM | POA: Diagnosis not present

## 2024-01-17 DIAGNOSIS — I10 Essential (primary) hypertension: Secondary | ICD-10-CM | POA: Diagnosis not present

## 2024-01-18 DIAGNOSIS — I10 Essential (primary) hypertension: Secondary | ICD-10-CM | POA: Diagnosis not present

## 2024-01-18 DIAGNOSIS — S72011A Unspecified intracapsular fracture of right femur, initial encounter for closed fracture: Secondary | ICD-10-CM | POA: Diagnosis not present

## 2024-01-18 DIAGNOSIS — E7800 Pure hypercholesterolemia, unspecified: Secondary | ICD-10-CM | POA: Diagnosis not present

## 2024-01-19 DIAGNOSIS — Z96641 Presence of right artificial hip joint: Secondary | ICD-10-CM | POA: Diagnosis not present

## 2024-01-19 DIAGNOSIS — M810 Age-related osteoporosis without current pathological fracture: Secondary | ICD-10-CM | POA: Diagnosis not present

## 2024-01-19 DIAGNOSIS — E119 Type 2 diabetes mellitus without complications: Secondary | ICD-10-CM | POA: Diagnosis not present

## 2024-01-19 DIAGNOSIS — I10 Essential (primary) hypertension: Secondary | ICD-10-CM | POA: Diagnosis not present

## 2024-01-19 DIAGNOSIS — Z7401 Bed confinement status: Secondary | ICD-10-CM | POA: Diagnosis not present

## 2024-01-19 DIAGNOSIS — E7800 Pure hypercholesterolemia, unspecified: Secondary | ICD-10-CM | POA: Diagnosis not present

## 2024-01-19 DIAGNOSIS — G894 Chronic pain syndrome: Secondary | ICD-10-CM | POA: Diagnosis not present

## 2024-01-19 DIAGNOSIS — I4891 Unspecified atrial fibrillation: Secondary | ICD-10-CM | POA: Diagnosis not present

## 2024-01-19 DIAGNOSIS — G8918 Other acute postprocedural pain: Secondary | ICD-10-CM | POA: Diagnosis not present

## 2024-01-19 DIAGNOSIS — S72041D Displaced fracture of base of neck of right femur, subsequent encounter for closed fracture with routine healing: Secondary | ICD-10-CM | POA: Diagnosis not present

## 2024-01-19 DIAGNOSIS — S72001D Fracture of unspecified part of neck of right femur, subsequent encounter for closed fracture with routine healing: Secondary | ICD-10-CM | POA: Diagnosis not present

## 2024-01-19 DIAGNOSIS — S72011A Unspecified intracapsular fracture of right femur, initial encounter for closed fracture: Secondary | ICD-10-CM | POA: Diagnosis not present

## 2024-01-19 DIAGNOSIS — I739 Peripheral vascular disease, unspecified: Secondary | ICD-10-CM | POA: Diagnosis not present

## 2024-01-19 DIAGNOSIS — R609 Edema, unspecified: Secondary | ICD-10-CM | POA: Diagnosis not present

## 2024-01-19 DIAGNOSIS — M80051D Age-related osteoporosis with current pathological fracture, right femur, subsequent encounter for fracture with routine healing: Secondary | ICD-10-CM | POA: Diagnosis not present

## 2024-01-19 DIAGNOSIS — R531 Weakness: Secondary | ICD-10-CM | POA: Diagnosis not present

## 2024-01-19 DIAGNOSIS — R2689 Other abnormalities of gait and mobility: Secondary | ICD-10-CM | POA: Diagnosis not present

## 2024-01-19 DIAGNOSIS — R262 Difficulty in walking, not elsewhere classified: Secondary | ICD-10-CM | POA: Diagnosis not present

## 2024-01-20 DIAGNOSIS — R262 Difficulty in walking, not elsewhere classified: Secondary | ICD-10-CM | POA: Diagnosis not present

## 2024-01-20 DIAGNOSIS — S72001D Fracture of unspecified part of neck of right femur, subsequent encounter for closed fracture with routine healing: Secondary | ICD-10-CM | POA: Diagnosis not present

## 2024-01-20 DIAGNOSIS — M810 Age-related osteoporosis without current pathological fracture: Secondary | ICD-10-CM | POA: Diagnosis not present

## 2024-01-20 DIAGNOSIS — G8918 Other acute postprocedural pain: Secondary | ICD-10-CM | POA: Diagnosis not present

## 2024-02-03 DIAGNOSIS — Z79899 Other long term (current) drug therapy: Secondary | ICD-10-CM | POA: Diagnosis not present

## 2024-02-03 DIAGNOSIS — I10 Essential (primary) hypertension: Secondary | ICD-10-CM | POA: Diagnosis not present

## 2024-02-03 DIAGNOSIS — G894 Chronic pain syndrome: Secondary | ICD-10-CM | POA: Diagnosis not present

## 2024-02-03 DIAGNOSIS — R2681 Unsteadiness on feet: Secondary | ICD-10-CM | POA: Diagnosis not present

## 2024-02-03 DIAGNOSIS — Z23 Encounter for immunization: Secondary | ICD-10-CM | POA: Diagnosis not present

## 2024-02-03 DIAGNOSIS — Z6825 Body mass index (BMI) 25.0-25.9, adult: Secondary | ICD-10-CM | POA: Diagnosis not present

## 2024-02-03 DIAGNOSIS — E1149 Type 2 diabetes mellitus with other diabetic neurological complication: Secondary | ICD-10-CM | POA: Diagnosis not present

## 2024-02-03 DIAGNOSIS — Z8781 Personal history of (healed) traumatic fracture: Secondary | ICD-10-CM | POA: Diagnosis not present

## 2024-02-24 DIAGNOSIS — I83019 Varicose veins of right lower extremity with ulcer of unspecified site: Secondary | ICD-10-CM | POA: Diagnosis not present

## 2024-02-24 DIAGNOSIS — I82511 Chronic embolism and thrombosis of right femoral vein: Secondary | ICD-10-CM | POA: Diagnosis not present

## 2024-02-24 DIAGNOSIS — L97919 Non-pressure chronic ulcer of unspecified part of right lower leg with unspecified severity: Secondary | ICD-10-CM | POA: Diagnosis not present

## 2024-02-24 DIAGNOSIS — E1149 Type 2 diabetes mellitus with other diabetic neurological complication: Secondary | ICD-10-CM | POA: Diagnosis not present

## 2024-02-26 DIAGNOSIS — M545 Low back pain, unspecified: Secondary | ICD-10-CM | POA: Diagnosis not present

## 2024-02-26 DIAGNOSIS — I83013 Varicose veins of right lower extremity with ulcer of ankle: Secondary | ICD-10-CM | POA: Diagnosis not present

## 2024-02-26 DIAGNOSIS — L03115 Cellulitis of right lower limb: Secondary | ICD-10-CM | POA: Diagnosis not present

## 2024-02-26 DIAGNOSIS — I83019 Varicose veins of right lower extremity with ulcer of unspecified site: Secondary | ICD-10-CM | POA: Diagnosis not present

## 2024-02-26 DIAGNOSIS — G8929 Other chronic pain: Secondary | ICD-10-CM | POA: Diagnosis not present

## 2024-02-26 DIAGNOSIS — T148XXA Other injury of unspecified body region, initial encounter: Secondary | ICD-10-CM | POA: Diagnosis not present

## 2024-02-26 DIAGNOSIS — D638 Anemia in other chronic diseases classified elsewhere: Secondary | ICD-10-CM | POA: Diagnosis not present

## 2024-02-26 DIAGNOSIS — Y999 Unspecified external cause status: Secondary | ICD-10-CM | POA: Diagnosis not present

## 2024-02-26 DIAGNOSIS — S81801A Unspecified open wound, right lower leg, initial encounter: Secondary | ICD-10-CM | POA: Diagnosis not present

## 2024-02-26 DIAGNOSIS — Z7901 Long term (current) use of anticoagulants: Secondary | ICD-10-CM | POA: Diagnosis not present

## 2024-02-26 DIAGNOSIS — E119 Type 2 diabetes mellitus without complications: Secondary | ICD-10-CM | POA: Diagnosis not present

## 2024-02-26 DIAGNOSIS — E1151 Type 2 diabetes mellitus with diabetic peripheral angiopathy without gangrene: Secondary | ICD-10-CM | POA: Diagnosis not present

## 2024-02-26 DIAGNOSIS — A419 Sepsis, unspecified organism: Secondary | ICD-10-CM | POA: Diagnosis not present

## 2024-02-26 DIAGNOSIS — L97919 Non-pressure chronic ulcer of unspecified part of right lower leg with unspecified severity: Secondary | ICD-10-CM | POA: Diagnosis not present

## 2024-02-26 DIAGNOSIS — F17220 Nicotine dependence, chewing tobacco, uncomplicated: Secondary | ICD-10-CM | POA: Diagnosis not present

## 2024-02-27 DIAGNOSIS — I83019 Varicose veins of right lower extremity with ulcer of unspecified site: Secondary | ICD-10-CM | POA: Diagnosis not present

## 2024-02-27 DIAGNOSIS — T148XXA Other injury of unspecified body region, initial encounter: Secondary | ICD-10-CM | POA: Diagnosis not present

## 2024-02-27 DIAGNOSIS — S81801A Unspecified open wound, right lower leg, initial encounter: Secondary | ICD-10-CM | POA: Diagnosis not present

## 2024-03-04 DIAGNOSIS — I83019 Varicose veins of right lower extremity with ulcer of unspecified site: Secondary | ICD-10-CM | POA: Diagnosis not present

## 2024-03-04 DIAGNOSIS — L97919 Non-pressure chronic ulcer of unspecified part of right lower leg with unspecified severity: Secondary | ICD-10-CM | POA: Diagnosis not present

## 2024-03-10 DIAGNOSIS — M25651 Stiffness of right hip, not elsewhere classified: Secondary | ICD-10-CM | POA: Diagnosis not present

## 2024-03-10 DIAGNOSIS — M25551 Pain in right hip: Secondary | ICD-10-CM | POA: Diagnosis not present

## 2024-03-10 DIAGNOSIS — R2689 Other abnormalities of gait and mobility: Secondary | ICD-10-CM | POA: Diagnosis not present

## 2024-03-11 DIAGNOSIS — I83019 Varicose veins of right lower extremity with ulcer of unspecified site: Secondary | ICD-10-CM | POA: Diagnosis not present

## 2024-03-11 DIAGNOSIS — I872 Venous insufficiency (chronic) (peripheral): Secondary | ICD-10-CM | POA: Diagnosis not present

## 2024-03-11 DIAGNOSIS — L97919 Non-pressure chronic ulcer of unspecified part of right lower leg with unspecified severity: Secondary | ICD-10-CM | POA: Diagnosis not present

## 2024-03-12 DIAGNOSIS — M25651 Stiffness of right hip, not elsewhere classified: Secondary | ICD-10-CM | POA: Diagnosis not present

## 2024-03-12 DIAGNOSIS — R2689 Other abnormalities of gait and mobility: Secondary | ICD-10-CM | POA: Diagnosis not present

## 2024-03-12 DIAGNOSIS — M25551 Pain in right hip: Secondary | ICD-10-CM | POA: Diagnosis not present

## 2024-03-15 DIAGNOSIS — M25651 Stiffness of right hip, not elsewhere classified: Secondary | ICD-10-CM | POA: Diagnosis not present

## 2024-03-15 DIAGNOSIS — R2689 Other abnormalities of gait and mobility: Secondary | ICD-10-CM | POA: Diagnosis not present

## 2024-03-15 DIAGNOSIS — M25551 Pain in right hip: Secondary | ICD-10-CM | POA: Diagnosis not present

## 2024-03-17 DIAGNOSIS — R2689 Other abnormalities of gait and mobility: Secondary | ICD-10-CM | POA: Diagnosis not present

## 2024-03-17 DIAGNOSIS — M25651 Stiffness of right hip, not elsewhere classified: Secondary | ICD-10-CM | POA: Diagnosis not present

## 2024-03-17 DIAGNOSIS — M25551 Pain in right hip: Secondary | ICD-10-CM | POA: Diagnosis not present

## 2024-03-18 DIAGNOSIS — L97919 Non-pressure chronic ulcer of unspecified part of right lower leg with unspecified severity: Secondary | ICD-10-CM | POA: Diagnosis not present

## 2024-03-18 DIAGNOSIS — I872 Venous insufficiency (chronic) (peripheral): Secondary | ICD-10-CM | POA: Diagnosis not present

## 2024-03-18 DIAGNOSIS — I83019 Varicose veins of right lower extremity with ulcer of unspecified site: Secondary | ICD-10-CM | POA: Diagnosis not present

## 2024-03-22 DIAGNOSIS — R2689 Other abnormalities of gait and mobility: Secondary | ICD-10-CM | POA: Diagnosis not present

## 2024-03-22 DIAGNOSIS — M25551 Pain in right hip: Secondary | ICD-10-CM | POA: Diagnosis not present

## 2024-03-22 DIAGNOSIS — M25651 Stiffness of right hip, not elsewhere classified: Secondary | ICD-10-CM | POA: Diagnosis not present

## 2024-03-24 DIAGNOSIS — R2689 Other abnormalities of gait and mobility: Secondary | ICD-10-CM | POA: Diagnosis not present

## 2024-03-24 DIAGNOSIS — M25651 Stiffness of right hip, not elsewhere classified: Secondary | ICD-10-CM | POA: Diagnosis not present

## 2024-03-24 DIAGNOSIS — M25551 Pain in right hip: Secondary | ICD-10-CM | POA: Diagnosis not present

## 2024-03-25 DIAGNOSIS — I83019 Varicose veins of right lower extremity with ulcer of unspecified site: Secondary | ICD-10-CM | POA: Diagnosis not present

## 2024-03-25 DIAGNOSIS — I872 Venous insufficiency (chronic) (peripheral): Secondary | ICD-10-CM | POA: Diagnosis not present

## 2024-03-25 DIAGNOSIS — L97919 Non-pressure chronic ulcer of unspecified part of right lower leg with unspecified severity: Secondary | ICD-10-CM | POA: Diagnosis not present

## 2024-03-29 DIAGNOSIS — R2689 Other abnormalities of gait and mobility: Secondary | ICD-10-CM | POA: Diagnosis not present

## 2024-03-29 DIAGNOSIS — M25551 Pain in right hip: Secondary | ICD-10-CM | POA: Diagnosis not present

## 2024-03-29 DIAGNOSIS — M25651 Stiffness of right hip, not elsewhere classified: Secondary | ICD-10-CM | POA: Diagnosis not present

## 2024-03-31 DIAGNOSIS — L97919 Non-pressure chronic ulcer of unspecified part of right lower leg with unspecified severity: Secondary | ICD-10-CM | POA: Diagnosis not present

## 2024-03-31 DIAGNOSIS — I872 Venous insufficiency (chronic) (peripheral): Secondary | ICD-10-CM | POA: Diagnosis not present

## 2024-03-31 DIAGNOSIS — I83019 Varicose veins of right lower extremity with ulcer of unspecified site: Secondary | ICD-10-CM | POA: Diagnosis not present
